# Patient Record
Sex: Female | Born: 1960 | Race: Black or African American | Hispanic: No | Marital: Single | Smoking: Current every day smoker
Health system: Southern US, Community
[De-identification: ages and names within clinical notes are randomized; demographics above are authoritative.]

## PROBLEM LIST (undated history)

## (undated) DIAGNOSIS — K219 Gastro-esophageal reflux disease without esophagitis: Secondary | ICD-10-CM

## (undated) DIAGNOSIS — Z789 Other specified health status: Secondary | ICD-10-CM

## (undated) HISTORY — PX: CERVICAL FUSION: SHX112

---

## 1997-11-08 ENCOUNTER — Other Ambulatory Visit: Admission: RE | Admit: 1997-11-08 | Discharge: 1997-11-08 | Payer: Self-pay | Admitting: Family Medicine

## 1998-07-09 DIAGNOSIS — F259 Schizoaffective disorder, unspecified: Secondary | ICD-10-CM | POA: Insufficient documentation

## 1998-08-01 ENCOUNTER — Encounter: Payer: Self-pay | Admitting: Family Medicine

## 1998-08-01 ENCOUNTER — Ambulatory Visit (HOSPITAL_COMMUNITY): Admission: RE | Admit: 1998-08-01 | Discharge: 1998-08-01 | Payer: Self-pay | Admitting: Family Medicine

## 1998-08-20 ENCOUNTER — Other Ambulatory Visit: Admission: RE | Admit: 1998-08-20 | Discharge: 1998-08-20 | Payer: Self-pay | Admitting: Obstetrics and Gynecology

## 1999-02-13 ENCOUNTER — Ambulatory Visit (HOSPITAL_COMMUNITY): Admission: RE | Admit: 1999-02-13 | Discharge: 1999-02-13 | Payer: Self-pay | Admitting: Obstetrics and Gynecology

## 2000-06-24 ENCOUNTER — Emergency Department (HOSPITAL_COMMUNITY): Admission: EM | Admit: 2000-06-24 | Discharge: 2000-06-24 | Payer: Self-pay | Admitting: Emergency Medicine

## 2000-09-10 ENCOUNTER — Ambulatory Visit (HOSPITAL_COMMUNITY): Admission: RE | Admit: 2000-09-10 | Discharge: 2000-09-10 | Payer: Self-pay | Admitting: Chiropractic Medicine

## 2000-09-10 ENCOUNTER — Encounter: Payer: Self-pay | Admitting: Chiropractic Medicine

## 2000-09-29 ENCOUNTER — Encounter: Admission: RE | Admit: 2000-09-29 | Discharge: 2000-09-29 | Payer: Self-pay | Admitting: Neurosurgery

## 2000-09-29 ENCOUNTER — Encounter: Payer: Self-pay | Admitting: Neurosurgery

## 2000-10-12 ENCOUNTER — Ambulatory Visit (HOSPITAL_COMMUNITY): Admission: RE | Admit: 2000-10-12 | Discharge: 2000-10-12 | Payer: Self-pay | Admitting: Neurosurgery

## 2000-10-12 ENCOUNTER — Encounter: Payer: Self-pay | Admitting: Neurosurgery

## 2000-10-27 ENCOUNTER — Other Ambulatory Visit: Admission: RE | Admit: 2000-10-27 | Discharge: 2000-10-27 | Payer: Self-pay | Admitting: Family Medicine

## 2000-12-05 ENCOUNTER — Emergency Department (HOSPITAL_COMMUNITY): Admission: EM | Admit: 2000-12-05 | Discharge: 2000-12-06 | Payer: Self-pay | Admitting: Emergency Medicine

## 2001-08-25 ENCOUNTER — Encounter: Payer: Self-pay | Admitting: Neurosurgery

## 2001-08-25 ENCOUNTER — Ambulatory Visit (HOSPITAL_COMMUNITY): Admission: RE | Admit: 2001-08-25 | Discharge: 2001-08-25 | Payer: Self-pay | Admitting: Neurosurgery

## 2001-09-06 ENCOUNTER — Encounter: Payer: Self-pay | Admitting: Neurosurgery

## 2001-09-06 ENCOUNTER — Ambulatory Visit (HOSPITAL_COMMUNITY): Admission: RE | Admit: 2001-09-06 | Discharge: 2001-09-06 | Payer: Self-pay | Admitting: Neurosurgery

## 2002-02-15 DIAGNOSIS — B0089 Other herpesviral infection: Secondary | ICD-10-CM

## 2002-06-07 ENCOUNTER — Emergency Department (HOSPITAL_COMMUNITY): Admission: EM | Admit: 2002-06-07 | Discharge: 2002-06-07 | Payer: Self-pay | Admitting: Emergency Medicine

## 2002-06-15 ENCOUNTER — Ambulatory Visit (HOSPITAL_COMMUNITY): Admission: RE | Admit: 2002-06-15 | Discharge: 2002-06-15 | Payer: Self-pay | Admitting: Neurosurgery

## 2002-06-15 ENCOUNTER — Encounter: Payer: Self-pay | Admitting: Neurosurgery

## 2002-06-27 ENCOUNTER — Emergency Department (HOSPITAL_COMMUNITY): Admission: EM | Admit: 2002-06-27 | Discharge: 2002-06-27 | Payer: Self-pay | Admitting: Emergency Medicine

## 2002-07-26 DIAGNOSIS — L219 Seborrheic dermatitis, unspecified: Secondary | ICD-10-CM

## 2002-07-31 ENCOUNTER — Encounter: Admission: RE | Admit: 2002-07-31 | Discharge: 2002-08-16 | Payer: Self-pay | Admitting: Family Medicine

## 2003-04-30 ENCOUNTER — Encounter: Payer: Self-pay | Admitting: Emergency Medicine

## 2003-04-30 ENCOUNTER — Emergency Department (HOSPITAL_COMMUNITY): Admission: EM | Admit: 2003-04-30 | Discharge: 2003-04-30 | Payer: Self-pay | Admitting: Emergency Medicine

## 2003-05-22 ENCOUNTER — Encounter: Admission: RE | Admit: 2003-05-22 | Discharge: 2003-05-22 | Payer: Self-pay | Admitting: Family Medicine

## 2004-01-30 DIAGNOSIS — N949 Unspecified condition associated with female genital organs and menstrual cycle: Secondary | ICD-10-CM

## 2004-09-10 ENCOUNTER — Ambulatory Visit (HOSPITAL_COMMUNITY): Admission: RE | Admit: 2004-09-10 | Discharge: 2004-09-10 | Payer: Self-pay | Admitting: Family Medicine

## 2005-11-09 ENCOUNTER — Other Ambulatory Visit: Admission: RE | Admit: 2005-11-09 | Discharge: 2005-11-09 | Payer: Self-pay | Admitting: Family Medicine

## 2005-11-09 ENCOUNTER — Ambulatory Visit: Payer: Self-pay | Admitting: Family Medicine

## 2005-11-09 ENCOUNTER — Encounter (INDEPENDENT_AMBULATORY_CARE_PROVIDER_SITE_OTHER): Payer: Self-pay | Admitting: Family Medicine

## 2005-11-09 LAB — CONVERTED CEMR LAB: Pap Smear: NORMAL

## 2006-06-04 ENCOUNTER — Emergency Department (HOSPITAL_COMMUNITY): Admission: EM | Admit: 2006-06-04 | Discharge: 2006-06-05 | Payer: Self-pay | Admitting: Emergency Medicine

## 2006-10-11 ENCOUNTER — Encounter: Admission: RE | Admit: 2006-10-11 | Discharge: 2006-10-11 | Payer: Self-pay | Admitting: Family Medicine

## 2006-10-14 ENCOUNTER — Emergency Department (HOSPITAL_COMMUNITY): Admission: EM | Admit: 2006-10-14 | Discharge: 2006-10-14 | Payer: Self-pay | Admitting: Emergency Medicine

## 2007-01-04 ENCOUNTER — Ambulatory Visit: Payer: Self-pay | Admitting: Family Medicine

## 2007-01-04 DIAGNOSIS — K219 Gastro-esophageal reflux disease without esophagitis: Secondary | ICD-10-CM

## 2007-02-19 ENCOUNTER — Emergency Department (HOSPITAL_COMMUNITY): Admission: EM | Admit: 2007-02-19 | Discharge: 2007-02-19 | Payer: Self-pay | Admitting: Emergency Medicine

## 2007-11-01 ENCOUNTER — Encounter (INDEPENDENT_AMBULATORY_CARE_PROVIDER_SITE_OTHER): Payer: Self-pay | Admitting: Family Medicine

## 2007-12-07 ENCOUNTER — Encounter: Admission: RE | Admit: 2007-12-07 | Discharge: 2007-12-07 | Payer: Self-pay | Admitting: Family Medicine

## 2008-02-20 DIAGNOSIS — IMO0001 Reserved for inherently not codable concepts without codable children: Secondary | ICD-10-CM

## 2008-03-05 ENCOUNTER — Telehealth (INDEPENDENT_AMBULATORY_CARE_PROVIDER_SITE_OTHER): Payer: Self-pay | Admitting: Nurse Practitioner

## 2008-03-20 ENCOUNTER — Ambulatory Visit: Payer: Self-pay | Admitting: Family Medicine

## 2008-03-20 ENCOUNTER — Encounter (INDEPENDENT_AMBULATORY_CARE_PROVIDER_SITE_OTHER): Payer: Self-pay | Admitting: Nurse Practitioner

## 2008-03-20 ENCOUNTER — Encounter (INDEPENDENT_AMBULATORY_CARE_PROVIDER_SITE_OTHER): Payer: Self-pay | Admitting: Family Medicine

## 2008-03-20 LAB — CONVERTED CEMR LAB
Bilirubin Urine: NEGATIVE
Chlamydia, DNA Probe: NEGATIVE
GC Probe Amp, Genital: NEGATIVE
Glucose, Urine, Semiquant: NEGATIVE
Ketones, urine, test strip: NEGATIVE
Nitrite: NEGATIVE
Protein, U semiquant: NEGATIVE
Specific Gravity, Urine: 1.025
Urobilinogen, UA: 0.2
pH: 5.5

## 2008-03-25 ENCOUNTER — Encounter (INDEPENDENT_AMBULATORY_CARE_PROVIDER_SITE_OTHER): Payer: Self-pay | Admitting: Family Medicine

## 2008-04-01 ENCOUNTER — Encounter (INDEPENDENT_AMBULATORY_CARE_PROVIDER_SITE_OTHER): Payer: Self-pay | Admitting: Family Medicine

## 2008-04-26 ENCOUNTER — Encounter (INDEPENDENT_AMBULATORY_CARE_PROVIDER_SITE_OTHER): Payer: Self-pay | Admitting: Family Medicine

## 2008-05-04 ENCOUNTER — Emergency Department (HOSPITAL_COMMUNITY): Admission: EM | Admit: 2008-05-04 | Discharge: 2008-05-04 | Payer: Self-pay | Admitting: Emergency Medicine

## 2008-05-05 ENCOUNTER — Encounter: Admission: RE | Admit: 2008-05-05 | Discharge: 2008-05-05 | Payer: Self-pay | Admitting: Neurology

## 2008-07-26 ENCOUNTER — Emergency Department (HOSPITAL_COMMUNITY): Admission: EM | Admit: 2008-07-26 | Discharge: 2008-07-26 | Payer: Self-pay | Admitting: Emergency Medicine

## 2008-08-04 ENCOUNTER — Encounter: Admission: RE | Admit: 2008-08-04 | Discharge: 2008-08-04 | Payer: Self-pay | Admitting: Neurosurgery

## 2008-08-08 ENCOUNTER — Emergency Department (HOSPITAL_COMMUNITY): Admission: EM | Admit: 2008-08-08 | Discharge: 2008-08-08 | Payer: Self-pay | Admitting: Emergency Medicine

## 2008-08-29 ENCOUNTER — Telehealth (INDEPENDENT_AMBULATORY_CARE_PROVIDER_SITE_OTHER): Payer: Self-pay | Admitting: Family Medicine

## 2008-09-06 ENCOUNTER — Encounter: Admission: RE | Admit: 2008-09-06 | Discharge: 2008-09-06 | Payer: Self-pay

## 2008-09-06 ENCOUNTER — Encounter (INDEPENDENT_AMBULATORY_CARE_PROVIDER_SITE_OTHER): Payer: Self-pay | Admitting: Family Medicine

## 2008-09-06 ENCOUNTER — Ambulatory Visit (HOSPITAL_COMMUNITY): Admission: RE | Admit: 2008-09-06 | Discharge: 2008-09-06 | Payer: Self-pay | Admitting: Neurosurgery

## 2008-09-07 ENCOUNTER — Encounter (INDEPENDENT_AMBULATORY_CARE_PROVIDER_SITE_OTHER): Payer: Self-pay | Admitting: Family Medicine

## 2008-09-07 ENCOUNTER — Ambulatory Visit (HOSPITAL_COMMUNITY): Admission: RE | Admit: 2008-09-07 | Discharge: 2008-09-08 | Payer: Self-pay | Admitting: Neurosurgery

## 2008-10-01 ENCOUNTER — Encounter: Admission: RE | Admit: 2008-10-01 | Discharge: 2008-10-01 | Payer: Self-pay | Admitting: Neurosurgery

## 2008-11-08 ENCOUNTER — Encounter: Admission: RE | Admit: 2008-11-08 | Discharge: 2008-12-07 | Payer: Self-pay | Admitting: Neurosurgery

## 2008-11-13 ENCOUNTER — Ambulatory Visit: Payer: Self-pay | Admitting: Family Medicine

## 2008-11-13 DIAGNOSIS — I803 Phlebitis and thrombophlebitis of lower extremities, unspecified: Secondary | ICD-10-CM

## 2008-12-05 ENCOUNTER — Ambulatory Visit: Payer: Self-pay | Admitting: Family Medicine

## 2008-12-07 ENCOUNTER — Encounter: Admission: RE | Admit: 2008-12-07 | Discharge: 2008-12-07 | Payer: Self-pay | Admitting: Family Medicine

## 2009-02-01 ENCOUNTER — Encounter (INDEPENDENT_AMBULATORY_CARE_PROVIDER_SITE_OTHER): Payer: Self-pay | Admitting: Nurse Practitioner

## 2009-02-20 ENCOUNTER — Ambulatory Visit: Payer: Self-pay | Admitting: Nurse Practitioner

## 2009-02-20 DIAGNOSIS — F17200 Nicotine dependence, unspecified, uncomplicated: Secondary | ICD-10-CM

## 2009-02-20 DIAGNOSIS — B009 Herpesviral infection, unspecified: Secondary | ICD-10-CM | POA: Insufficient documentation

## 2009-02-20 LAB — CONVERTED CEMR LAB
Bilirubin Urine: NEGATIVE
Glucose, Urine, Semiquant: NEGATIVE
KOH Prep: NEGATIVE
Nitrite: POSITIVE
Protein, U semiquant: 30
Specific Gravity, Urine: 1.025
Urobilinogen, UA: 0.2
WBC Urine, dipstick: NEGATIVE
pH: 5.5

## 2009-02-21 ENCOUNTER — Encounter (INDEPENDENT_AMBULATORY_CARE_PROVIDER_SITE_OTHER): Payer: Self-pay | Admitting: Internal Medicine

## 2009-05-10 ENCOUNTER — Ambulatory Visit: Payer: Self-pay | Admitting: Nurse Practitioner

## 2009-05-10 DIAGNOSIS — M539 Dorsopathy, unspecified: Secondary | ICD-10-CM | POA: Insufficient documentation

## 2009-05-10 LAB — CONVERTED CEMR LAB
Bilirubin Urine: NEGATIVE
Chlamydia, Swab/Urine, PCR: NEGATIVE
GC Probe Amp, Urine: NEGATIVE
Glucose, Urine, Semiquant: NEGATIVE
KOH Prep: NEGATIVE
Ketones, urine, test strip: NEGATIVE
Nitrite: NEGATIVE
Protein, U semiquant: NEGATIVE
Specific Gravity, Urine: 1.015
Urobilinogen, UA: 0.2
WBC Urine, dipstick: NEGATIVE
pH: 6.5

## 2009-05-20 ENCOUNTER — Telehealth (INDEPENDENT_AMBULATORY_CARE_PROVIDER_SITE_OTHER): Payer: Self-pay | Admitting: Nurse Practitioner

## 2009-05-29 ENCOUNTER — Telehealth (INDEPENDENT_AMBULATORY_CARE_PROVIDER_SITE_OTHER): Payer: Self-pay | Admitting: Nurse Practitioner

## 2009-05-31 ENCOUNTER — Ambulatory Visit: Payer: Self-pay | Admitting: Nurse Practitioner

## 2009-05-31 LAB — CONVERTED CEMR LAB
Bilirubin Urine: NEGATIVE
Blood in Urine, dipstick: NEGATIVE
Glucose, Urine, Semiquant: NEGATIVE
Ketones, urine, test strip: NEGATIVE
Nitrite: NEGATIVE
Protein, U semiquant: 30
Specific Gravity, Urine: >=1.03
Urobilinogen, UA: 0.2
pH: 5.5

## 2009-06-03 ENCOUNTER — Telehealth (INDEPENDENT_AMBULATORY_CARE_PROVIDER_SITE_OTHER): Payer: Self-pay | Admitting: Nurse Practitioner

## 2009-06-03 ENCOUNTER — Encounter (INDEPENDENT_AMBULATORY_CARE_PROVIDER_SITE_OTHER): Payer: Self-pay | Admitting: Nurse Practitioner

## 2009-06-03 LAB — CONVERTED CEMR LAB
Chlamydia, Swab/Urine, PCR: NEGATIVE
GC Probe Amp, Urine: NEGATIVE

## 2009-06-04 ENCOUNTER — Encounter (INDEPENDENT_AMBULATORY_CARE_PROVIDER_SITE_OTHER): Payer: Self-pay | Admitting: Nurse Practitioner

## 2009-06-18 ENCOUNTER — Telehealth (INDEPENDENT_AMBULATORY_CARE_PROVIDER_SITE_OTHER): Payer: Self-pay | Admitting: Nurse Practitioner

## 2009-06-20 ENCOUNTER — Ambulatory Visit: Payer: Self-pay | Admitting: Physician Assistant

## 2009-06-20 ENCOUNTER — Encounter (INDEPENDENT_AMBULATORY_CARE_PROVIDER_SITE_OTHER): Payer: Self-pay | Admitting: Nurse Practitioner

## 2009-06-20 ENCOUNTER — Other Ambulatory Visit: Admission: RE | Admit: 2009-06-20 | Discharge: 2009-06-20 | Payer: Self-pay | Admitting: Internal Medicine

## 2009-06-20 DIAGNOSIS — R82998 Other abnormal findings in urine: Secondary | ICD-10-CM | POA: Insufficient documentation

## 2009-06-20 DIAGNOSIS — B373 Candidiasis of vulva and vagina: Secondary | ICD-10-CM

## 2009-06-20 LAB — CONVERTED CEMR LAB
Bilirubin Urine: NEGATIVE
Chlamydia, DNA Probe: NEGATIVE
GC Probe Amp, Genital: NEGATIVE
Glucose, Urine, Semiquant: NEGATIVE
Ketones, urine, test strip: NEGATIVE
Nitrite: NEGATIVE
Specific Gravity, Urine: >=1.03
Urobilinogen, UA: 0.2
WBC Urine, dipstick: NEGATIVE
Whiff Test: POSITIVE
pH: 5.5

## 2009-06-21 ENCOUNTER — Encounter: Payer: Self-pay | Admitting: Physician Assistant

## 2009-06-27 ENCOUNTER — Telehealth (INDEPENDENT_AMBULATORY_CARE_PROVIDER_SITE_OTHER): Payer: Self-pay | Admitting: Nurse Practitioner

## 2009-06-28 ENCOUNTER — Encounter (INDEPENDENT_AMBULATORY_CARE_PROVIDER_SITE_OTHER): Payer: Self-pay | Admitting: Nurse Practitioner

## 2009-07-15 ENCOUNTER — Telehealth (INDEPENDENT_AMBULATORY_CARE_PROVIDER_SITE_OTHER): Payer: Self-pay | Admitting: Nurse Practitioner

## 2009-07-19 ENCOUNTER — Encounter: Payer: Self-pay | Admitting: Nurse Practitioner

## 2009-08-15 ENCOUNTER — Ambulatory Visit: Payer: Self-pay | Admitting: Nurse Practitioner

## 2009-08-15 DIAGNOSIS — R030 Elevated blood-pressure reading, without diagnosis of hypertension: Secondary | ICD-10-CM | POA: Insufficient documentation

## 2009-08-16 ENCOUNTER — Telehealth (INDEPENDENT_AMBULATORY_CARE_PROVIDER_SITE_OTHER): Payer: Self-pay | Admitting: Nurse Practitioner

## 2009-08-19 ENCOUNTER — Encounter (INDEPENDENT_AMBULATORY_CARE_PROVIDER_SITE_OTHER): Payer: Self-pay | Admitting: *Deleted

## 2009-10-14 ENCOUNTER — Telehealth (INDEPENDENT_AMBULATORY_CARE_PROVIDER_SITE_OTHER): Payer: Self-pay | Admitting: *Deleted

## 2009-11-04 ENCOUNTER — Telehealth (INDEPENDENT_AMBULATORY_CARE_PROVIDER_SITE_OTHER): Payer: Self-pay | Admitting: Nurse Practitioner

## 2009-11-07 ENCOUNTER — Encounter (INDEPENDENT_AMBULATORY_CARE_PROVIDER_SITE_OTHER): Payer: Self-pay | Admitting: *Deleted

## 2009-11-12 ENCOUNTER — Ambulatory Visit: Payer: Self-pay | Admitting: Nurse Practitioner

## 2009-11-12 DIAGNOSIS — R21 Rash and other nonspecific skin eruption: Secondary | ICD-10-CM | POA: Insufficient documentation

## 2009-12-24 ENCOUNTER — Encounter (INDEPENDENT_AMBULATORY_CARE_PROVIDER_SITE_OTHER): Payer: Self-pay | Admitting: Nurse Practitioner

## 2009-12-24 ENCOUNTER — Telehealth (INDEPENDENT_AMBULATORY_CARE_PROVIDER_SITE_OTHER): Payer: Self-pay | Admitting: Nurse Practitioner

## 2009-12-24 DIAGNOSIS — R519 Headache, unspecified: Secondary | ICD-10-CM | POA: Insufficient documentation

## 2009-12-24 DIAGNOSIS — R51 Headache: Secondary | ICD-10-CM

## 2010-02-24 ENCOUNTER — Encounter (INDEPENDENT_AMBULATORY_CARE_PROVIDER_SITE_OTHER): Payer: Self-pay | Admitting: Nurse Practitioner

## 2010-08-03 ENCOUNTER — Encounter: Payer: Self-pay | Admitting: Neurosurgery

## 2010-08-03 ENCOUNTER — Encounter: Payer: Self-pay | Admitting: Occupational Therapy

## 2010-08-12 NOTE — Assessment & Plan Note (Signed)
Summary: Acute - Rash   Vital Signs:  Patient profile:   50 year old female Menstrual status:  regular Weight:      173.4 pounds BMI:     29.87 BSA:     1.84 Temp:     97.8 degrees F oral Pulse rate:   81 / minute Pulse rhythm:   regular Resp:     20 per minute BP sitting:   125 / 84  (left arm) Cuff size:   regular  Vitals Entered By: Levon Hedger (Nov 12, 2009 12:44 PM) CC: bed bug bites with alot of itching..needs to discuss herpes , Rash Is Patient Diabetic? No Pain Assessment Patient in pain? no       Does patient need assistance? Functional Status Self care Ambulation Normal   CC:  bed bug bites with alot of itching..needs to discuss herpes  and Rash.  History of Present Illness:  Pt into the office with complaints of rash - only on back  Rash      This is a 50 year old woman who presents with Rash.  The symptoms began 3 days ago.  The severity is described as moderate.  The patient complains of itching, but denies pustules and blisters.  The rash is located on the back.  The rash is worse with scratching.  The patient denies the following symptoms: fever and headache.  The patient reports a history of other insect bite.   Reports that she purchased a sofa from a second hand store.  noticed problems with back after that purchase. Pt is married and notes that her husband has also had similar problems with skin,  Allergies (verified): No Known Drug Allergies  Review of Systems GU:  Complains of genital sores; denies dysuria; vaginal tingling and itching - Dx with herpes last year and took medications once.  lesions have returned about 2 months ago. Derm:  Complains of rash.  Physical Exam  General:  alert.   Head:  normocephalic.   Lungs:  normal breath sounds.   Heart:  normal rate and regular rhythm.   Neurologic:  alert & oriented X3.   Skin:  back with sporatic lesions, crusted no other areas of involvement Psych:  Oriented X3.     Impression &  Recommendations:  Problem # 1:  SKIN RASH (ICD-782.1) handout given to pt about bed bugs advised pt that sofa acquired second hand may be source she need to clean her house Her updated medication list for this problem includes:    Triamcinolone Acetonide 0.1 % Oint (Triamcinolone acetonide) .Marland Kitchen... Apply to affected area two times a day as needed to affected area  Problem # 2:  HSV (ICD-054.9) reviewed dx with pt will treat with antivirals  Complete Medication List: 1)  Valtrex 1 Gm Tabs (Valacyclovir hcl) .... One gram two times a day x 10 days 2)  Zanaflex 2 Mg Caps (Tizanidine hcl) .... One tablet by mouth two times a day 3)  Amitriptyline Hcl 25 Mg Tabs (Amitriptyline hcl) .... One tablet by mouth in the morning and 2 tablets at bedtime 4)  Diclofenac Sodium 75 Mg Tbec (Diclofenac sodium) .... One tablet by mouth two times a day 5)  Triamcinolone Acetonide 0.1 % Oint (Triamcinolone acetonide) .... Apply to affected area two times a day as needed to affected area 6)  Permethrin 5 % Crea (Permethrin) .... Massage cream ino entire body - wash off in 8 to 12 hours  Patient Instructions: 1)  Read  the handout  2)  follow up as needed Prescriptions: PERMETHRIN 5 % CREA (PERMETHRIN) Massage cream ino entire body - wash off in 8 to 12 hours  #60gm x 0   Entered and Authorized by:   Lehman Prom FNP   Signed by:   Lehman Prom FNP on 11/12/2009   Method used:   Print then Give to Patient   RxID:   [id number] TRIAMCINOLONE ACETONIDE 0.1 % OINT (TRIAMCINOLONE ACETONIDE) Apply to affected area two times a day as needed to affected area  #60gm x 0   Entered and Authorized by:   Lehman Prom FNP   Signed by:   Lehman Prom FNP on 11/12/2009   Method used:   Print then Give to Patient   RxID:   [id number] VALTREX 1 GM TABS (VALACYCLOVIR HCL) One gram two times a day x 10 days  #20 x 0   Entered and Authorized by:   Lehman Prom FNP   Signed by:   Lehman Prom FNP  on 11/12/2009   Method used:   Print then Give to Patient   RxID:   [id number]

## 2010-08-12 NOTE — Letter (Signed)
Summary: *HSN Results Follow up  [facility]  [address] [place], [place] [address]   Phone: [phone]  Fax: [phone]      11/07/2009   Shiryl D Holben [address] [place], [place]  [address]   Dear  Ms. Rene Kocher [place],                            ____S.Drinkard,FNP   ____D. Gore,FNP       ____B. McPherson,MD   ____V. Rankins,MD    ____E. Mulberry,MD    ____N. Daphine Deutscher, FNP  ____D. Reche Dixon, MD    ____K. Philipp Deputy, MD    ____Other     This letter is to inform you that your recent test(s):  _______Pap Smear    _______Lab Test     _______X-ray    _______ is within acceptable limits  _______ requires a medication change  _______ requires a follow-up lab visit  _______ requires a follow-up visit with your provider   Comments:  We have been trying to reach you.  Please give the office a call at your eariest convenience.       _________________________________________________________ If you have any questions, please contact our office                     Sincerely,  [place] Shannon [facility]

## 2010-08-12 NOTE — Assessment & Plan Note (Signed)
Summary: Missed appointment  Pt checked in then left the building. CMA called her name twice and pt was not in the building. When Ms. Schepp returned she  informed the front desk staff that she had gone to get her something to eat. The provider had moved past the pt's appointment and she was advised to reschedule see if somone no-shows in the afternoon.  Vital Signs:  Patient profile:   50 year old female Menstrual status:  regular Pulse rhythm:   regular Cuff size:   regular  Vitals Entered By: Levon Hedger (July 19, 2009 11:58 AM)  Allergies: No Known Drug Allergies   Complete Medication List: 1)  Valtrex 1 Gm Tabs (Valacyclovir hcl) .... One gram two times a day x 10 days 2)  Zanaflex 2 Mg Caps (Tizanidine hcl) .... One tablet by mouth two times a day 3)  Amitriptyline Hcl 25 Mg Tabs (Amitriptyline hcl) .... One tablet by mouth in the morning and 2 tablets at bedtime 4)  Diclofenac Sodium 75 Mg Tbec (Diclofenac sodium) .... One tablet by mouth two times a day 5)  Sulfamethoxazole-tmp Ds 800-160 Mg Tabs (Sulfamethoxazole-trimethoprim) .... One tablet by mouth two times a day for infection 6)  Diflucan 150 Mg Tabs (Fluconazole) .Marland Kitchen.. 1 by mouth x 1

## 2010-08-12 NOTE — Progress Notes (Signed)
  Phone Note Outgoing Call   Summary of Call: Left message on answering machine for pt to call back..... to inform about the no shows Initial call taken by: [place] Shannon,  November 04, 2009 9:12 AM  Follow-up for Phone Call        Left message on answering machine for pt to call back.Marland KitchenMarland KitchenArmenia Shannon  November 06, 2009 10:13 AM    Left message on answering machine for pt to call back... will mail letter.... [place] Shannon  November 07, 2009 12:29 PM

## 2010-08-12 NOTE — Progress Notes (Signed)
Summary: DIDNT MAKE ULTRASOUND APPT TODAY  Phone Note Call from Patient Call back at Home Phone [phone]   Summary of Call: MARTIN PT. MS [place] CALLED TO LET YOU KNOW THAT SHE DIDN'T MAKE THE APPT. ON TODAY FOR HER ULTRASOUND, AND WOULD LIKE FOR [place] TO RESCHEDULE IT FOR HER. Initial call taken by: Leodis Rains,  August 16, 2009 2:32 PM  Follow-up for Phone Call        forward to N. Daphine Deutscher, FNP Follow-up by: Levon Hedger,  August 16, 2009 2:55 PM  Additional Follow-up for Phone Call Additional follow up Details #1::        Why did pt miss the appt? Ok to reschedule but let pt know that the office is not able to keep rescheduling the appt when she misses Additional Follow-up by: Lehman Prom FNP,  August 16, 2009 4:24 PM    Additional Follow-up for Phone Call Additional follow up Details #2::    called [phone] this is not a valid # Levon Hedger  August 16, 2009 4:30 PM  Levon Hedger  August 19, 2009 12:32 PM called [phone] the number you have dialed is not valid.  Will mail letter.
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
## 2010-08-12 NOTE — Letter (Signed)
Summary: TEST ORDER FORM//ULTRASOUND//APPT DATE & TIME  TEST ORDER FORM//ULTRASOUND//APPT DATE & TIME   Imported By: Arta Bruce 10/07/2009 15:33:26  _____________________________________________________________________  External Attachment:    Type:   Image     Comment:   External Document

## 2010-08-12 NOTE — Progress Notes (Signed)
Summary: Pt needs the provider to call her back  Phone Note Call from Patient Call back at [facility] Phone [phone] Call back at [phone]   Summary of Call: The pt wants the provider call her back and speak about herpes virus. V[facility] FNP Initial call taken by: Morgan Obrien,  October 14, 2009 4:49 PM  Follow-up for Phone Call        Morgan Obrien October 14, 2009 11:45 AM Left message on machine for pt to return call to the office  Morgan Obrien  October 15, 2009 3:57 PM Left message on the machine for pt to return call to the office.  The pt will come on October 18, 2009 at 9:30 .Morgan Obrien  October 16, 2009 11:52 AM

## 2010-08-12 NOTE — Progress Notes (Signed)
Summary: Nausea  Phone Note Call from Patient   Summary of Call: spoke with pt and she said she is having nausea...Marland KitchenMarland Kitchen pt says this started today.... pt denies vomiting.... pt did eat a sandwich.... pt wanted to [place] to know since she just came here and due to her conditions on that day she wanted to make sure everything is okay Initial call taken by: [place] Shannon,  July 15, 2009 4:38 PM  Follow-up for Phone Call        advise bland diet plenty of fluids may last 24 or 48 hours if symtoms continue over 72 hours she will need to be seen Follow-up by: Lehman Prom FNP,  July 15, 2009 5:04 PM  Additional Follow-up for Phone Call Additional follow up Details #1::        spoke with pt and she is advised to do bland diet...Marland KitchenMarland KitchenArmenia Shannon  July 16, 2009 12:10 PM

## 2010-08-12 NOTE — Letter (Signed)
Summary: [facility]   Imported By: Arta Bruce 12/26/2009 10:06:26  _____________________________________________________________________  External Attachment:    Type:   Image     Comment:   External Document

## 2010-08-12 NOTE — Letter (Signed)
Summary: GYNECOLOGIC CYTOLOGY REPORT  GYNECOLOGIC CYTOLOGY REPORT   Imported By: Arta Bruce 08/15/2009 14:51:41  _____________________________________________________________________  External Attachment:    Type:   Image     Comment:   External Document
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
## 2010-08-12 NOTE — Letter (Signed)
Summary: [facility]  [facility]   Imported By: Arta Bruce 03/06/2010 14:36:43  _____________________________________________________________________  External Attachment:    Type:   Image     Comment:   External Document

## 2010-08-12 NOTE — Letter (Signed)
Summary: *HSN Results Follow up  [facility]  [address] [place], [place] [address]   Phone: [phone]  Fax: [phone]      08/19/2009   Baby D Tipping [address] [place], [place]  [address]   Dear  Ms. Rene Kocher [place],                            ____S.Drinkard,FNP   ____D. Gore,FNP       ____B. McPherson,MD   ____V. Rankins,MD    ____E. Mulberry,MD    ____N. Daphine Deutscher, FNP  ____D. Reche Dixon, MD    ____K. Philipp Deputy, MD    ____Other     This letter is to inform you that your recent test(s):  _______Pap Smear    _______Lab Test     _______X-ray    _______ is within acceptable limits  _______ requires a medication change  _______ requires a follow-up lab visit  _______ requires a follow-up visit with your provider   Comments: We have been trying to reach you at [phone].  Please contact the office at your earliest convenience.      _________________________________________________________ If you have any questions, please contact our office                     Sincerely,  Levon Hedger [facility]
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
## 2010-10-28 LAB — CBC
Platelets: 289 10*3/uL (ref 150–400)
RDW: 13.8 % (ref 11.5–15.5)

## 2010-11-25 NOTE — Op Note (Signed)
NAME:  Morgan Obrien, Morgan Obrien             ACCOUNT NO.:  [id number]   MEDICAL RECORD NO.:  [id number]          PATIENT TYPE:  OIB   LOCATION:  3528                         FACILITY:  [facility]   PHYSICIAN:  Reinaldo Meeker, M.D. 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   PROCEDURE:  C3-4 and C4-5 anterior cervical diskectomy with bone bank  fusion followed by Helix anterior cervical plating.   SURGEON:  Reinaldo Meeker, MD   ASSISTANT:  Kathaleen Maser. Pool, MD   SURGICAL INDICATIONS:  Morgan Obrien is a 50 year old female with history  of neck and bilateral upper extremity pain, more so on the left than the  right.  She also had signs of cervical myelopathy.  She tried  conservative therapy without improvement.  MRI scan showed a herniated  disk at C3-4 with spinal cord compression and small central disk at C4-5  with spinal cord compression.  In addition, she had a small disk  abnormality at C6-7 and this was not felt to be clinically significant.  After discussing the options and failing conservative therapy, she  requested surgical intervention.  She is now brought to the hospital for  C3-4 and C4-5 anterior cervical diskectomy with fusion and plating.  I  had a long discussion with her regarding all the risks and benefits of  surgical intervention.  The risks discussed include but are limited to  bleeding, infection, paralysis, spinal fluid leak, trouble with  instrumentation, nonunion versus coma, and death.  We have discussed the  chance of persistent pain requiring more surgery.  We discussed  alternative methods of therapy and all the risks and benefits of non-  intervention.  Morgan Obrien had the opportunity to ask  numerous  questions and appears to understand.  With this information in hand, she  has requested to proceed with surgery and she is brought to the hospital  at this time for surgical intervention.   PROCEDURE IN DETAIL:  After placed in the supine position and a 5-pound  halter traction, the patient's neck was prepped and draped in the usual  sterile fashion.  Localizing fluoroscopy was used prior to incision to  identify the appropriate level.  Transverse incision was made on the  right anterior neck started at the midline, headed towards the medial  aspect of the sternocleidomastoid muscle.  The platysma muscle was then  incised transversely.  A natural fascial plane between the strap muscles  medially and sternocleidomastoid laterally was identified and followed  down to the anterior aspect of the cervical spine.  Longus colli muscles  were identified, split in the midline, stripped away bilaterally with a  Medical illustrator.  Self-retaining retractor was placed for  exposure.  X-rays showed approach to be at the appropriate levels.  Using a 15 blade, the herniated disk at C3-4 and  C4-5 was incised.  Using pituitary rongeurs and curettes, approximately 90% disk material  was removed at both levels.  High-speed drill was used to widen the  interspace at both levels.  At this time, the microscope was draped,  brought in the field, and used for the remainder of the case.  Starting  at C3-4, the remainder of the disk material and posterior longitudinal  ligament was removed. The ligament was then incised and the cut edges  were removed with Kerrison punch.  Herniated disk material was removed  along with bony overgrowth until the spinal dura and proximal foraminal  nerve roots could be identified and were found to be well decompressed  bilaterally.  Irrigation was carried out and attention was then turned  to C4-5 where a similar procedure was carried out.  Once again,   herniated disk material in the midline was removed, which had been  compressing the spinal cord.  A thorough decompression was once again  carried out in the spinal dura and proximal foraminal nerve roots  bilaterally at this level as well until there was no evidence of neural  compression at either level.  At this time, inspection was carried out  once more.  Two 7-mm bone bank plugs were reconstituted.  After  irrigating once more confirming hemostasis, the plugs impacted without  difficulty and fluoroscopy showed that to be in good position.  A 42-mm  anterior cervical plate was then chosen.  Under fluoroscopic guidance,  drill holes were placed followed by placing of 13-mm screws x6 until the  locking mechanism was secured bilaterally at all 3 levels.  Large  amounts of irrigation were carried out and the bleeding was controlled  with bipolar coagulation.  The wound was then closed with interrupted  Vicryl on the platysma muscle, inverted 5-0 PDS on the subcuticular  layer, and Steri-Strips on the skin.  A sterile dressing and soft collar  were applied.  The patient was extubated and taken to the recovery room  in stable condition.           ______________________________  Reinaldo Meeker, M.D.     ROK/MEDQ  D:  09/07/2008  T:  09/07/2008  Job:  [id number]

## 2011-10-02 ENCOUNTER — Telehealth: Payer: Self-pay | Admitting: *Deleted

## 2011-10-02 NOTE — Telephone Encounter (Signed)
Pt No Show for Previst on 10/02/2011.  Called both telephone numbers listed as contacts numbers for pt.  Phones have been disconnected.  No Show letter mailed to pt. Ezra Sites

## 2011-10-20 ENCOUNTER — Other Ambulatory Visit: Payer: Self-pay | Admitting: Gastroenterology

## 2012-11-04 ENCOUNTER — Other Ambulatory Visit: Payer: Self-pay

## 2012-11-07 ENCOUNTER — Other Ambulatory Visit: Payer: Self-pay

## 2012-11-07 DIAGNOSIS — Z1231 Encounter for screening mammogram for malignant neoplasm of breast: Secondary | ICD-10-CM

## 2012-11-16 ENCOUNTER — Ambulatory Visit: Payer: Self-pay

## 2012-11-28 ENCOUNTER — Ambulatory Visit
Admission: RE | Admit: 2012-11-28 | Discharge: 2012-11-28 | Disposition: A | Discharge status: Home / Self Care | Payer: Medicaid Other | Source: Ambulatory Visit

## 2012-11-28 ENCOUNTER — Ambulatory Visit: Payer: Self-pay

## 2012-11-28 DIAGNOSIS — Z1231 Encounter for screening mammogram for malignant neoplasm of breast: Secondary | ICD-10-CM

## 2012-11-29 ENCOUNTER — Other Ambulatory Visit: Payer: Self-pay | Admitting: Family Medicine

## 2012-11-29 DIAGNOSIS — R928 Other abnormal and inconclusive findings on diagnostic imaging of breast: Secondary | ICD-10-CM

## 2012-12-12 ENCOUNTER — Ambulatory Visit
Admission: RE | Admit: 2012-12-12 | Discharge: 2012-12-12 | Disposition: A | Discharge status: Home / Self Care | Payer: Medicaid Other | Source: Ambulatory Visit | Attending: Family Medicine | Admitting: Family Medicine

## 2012-12-12 DIAGNOSIS — R928 Other abnormal and inconclusive findings on diagnostic imaging of breast: Secondary | ICD-10-CM

## 2013-11-10 ENCOUNTER — Other Ambulatory Visit: Payer: Self-pay

## 2013-11-10 DIAGNOSIS — Z1231 Encounter for screening mammogram for malignant neoplasm of breast: Secondary | ICD-10-CM

## 2013-11-30 ENCOUNTER — Ambulatory Visit: Payer: Medicaid Other

## 2015-05-28 ENCOUNTER — Encounter (HOSPITAL_COMMUNITY)
Admission: RE | Admit: 2015-05-28 | Discharge: 2015-05-28 | Disposition: A | Discharge status: Home / Self Care | Payer: Medicaid Other | Source: Ambulatory Visit | Attending: Oral Surgery | Admitting: Oral Surgery

## 2015-05-28 ENCOUNTER — Other Ambulatory Visit (HOSPITAL_COMMUNITY): Payer: Medicaid Other

## 2015-05-28 ENCOUNTER — Encounter (HOSPITAL_COMMUNITY): Payer: Self-pay

## 2015-05-28 DIAGNOSIS — M270 Developmental disorders of jaws: Secondary | ICD-10-CM | POA: Insufficient documentation

## 2015-05-28 DIAGNOSIS — K219 Gastro-esophageal reflux disease without esophagitis: Secondary | ICD-10-CM | POA: Diagnosis not present

## 2015-05-28 DIAGNOSIS — Z7982 Long term (current) use of aspirin: Secondary | ICD-10-CM | POA: Insufficient documentation

## 2015-05-28 DIAGNOSIS — Z01812 Encounter for preprocedural laboratory examination: Secondary | ICD-10-CM | POA: Diagnosis not present

## 2015-05-28 DIAGNOSIS — M278 Other specified diseases of jaws: Secondary | ICD-10-CM | POA: Diagnosis not present

## 2015-05-28 DIAGNOSIS — Z01818 Encounter for other preprocedural examination: Secondary | ICD-10-CM | POA: Diagnosis present

## 2015-05-28 DIAGNOSIS — R079 Chest pain, unspecified: Secondary | ICD-10-CM | POA: Diagnosis not present

## 2015-05-28 HISTORY — DX: Gastro-esophageal reflux disease without esophagitis: K21.9

## 2015-05-28 HISTORY — DX: Other specified health status: Z78.9

## 2015-05-28 NOTE — Progress Notes (Signed)
Patient reports sharp center chest pain with drinking soda only. Patient states that it does not come on any other time and that it feels similar to when she has had GERD in the past. Patient reports that the only time she get short of breath is after ~ 3 flights of steps. Patient denied getting Shob after walking from parking lot into Short stay today. Notified [place], PA of same will obtain EKG no CXR. PCP Herold HarmsBeal at [facility]. Denies other cardiac test.

## 2015-05-28 NOTE — H&P (Signed)
HISTORY AND PHYSICAL  Morgan Obrien is a 54 y.o. female patient  Referred by general dentist for pre-prosthetic surgery for dentures.  No diagnosis found.  Past Medical History  Diagnosis Date  . GERD (gastroesophageal reflux disease)   . Medical history non-contributory     No current facility-administered medications for this encounter.   Current Outpatient Prescriptions  Medication Sig Dispense Refill  . ibuprofen (ADVIL,MOTRIN) 200 MG tablet Take 200 mg by mouth every 6 (six) hours as needed for mild pain.    . Multiple Vitamin (MULTIVITAMIN WITH MINERALS) TABS tablet Take 1 tablet by mouth daily.    Marland Kitchen. aspirin 325 MG tablet Take 325 mg by mouth every 4 (four) hours as needed.     No Known Allergies Active Problems:   * No active hospital problems. *  Vitals: There were no vitals taken for this visit. Lab results:No results found for this or any previous visit (from the past 24 hour(s)). Radiology Results: No results found. General appearance: alert and cooperative Head: Normocephalic, without obvious abnormality, atraumatic Eyes: negative Nose: Nares normal. Septum midline. Mucosa normal. No drainage or sinus tenderness. Throat: Maxillary palatal torus, Hyperplastic maxillary osseous tuberosities,  Bilateral maxillary exostoses. 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  Plan: reduce maxillary exostoses, tuberosity, and palatal torus. General anesthesia. Day surgery.   Georgia LopesJENSEN,Lakena Sparlin M 05/28/2015

## 2015-05-28 NOTE — Pre-Procedure Instructions (Signed)
Morgan JulianRegina D Obrien  05/28/2015     Your procedure is scheduled on November 18.  [facility] Ad[facility] at 6:45 A.M.  Call this number if you have problems the morning of surgery:  [phone]   Remember:  Do not eat food or drink liquids after midnight.  Take these medicines the morning of surgery with A SIP OF WATER: None    STOP/ Do not take Aspirin, Aleve, Naproxen, Advil, Ibuprofen, Motrin, Vitamins, Herbs, or Supplements starting today   Do not wear jewelry, make-up or nail polish.  Do not wear lotions, powders, or perfumes.  You may wear deodorant.  Do not shave 48 hours prior to surgery.  Men may shave face and neck.  Do not bring valuables to the hospital.  [facility] is not responsible for any belongings or valuables.  Contacts, dentures or bridgework may not be worn into surgery.  Leave your suitcase in the car.  After surgery it may be brought to your room.  For patients admitted to the hospital, discharge time will be determined by your treatment team.  Patients discharged the day of surgery will not be allowed to drive home.   Name and phone number of your driver: Family/ Friend  [facility] - Preparing for Surgery  Before surgery, you can play an important role.  Because skin is not sterile, your skin needs to be as free of germs as possible.  You can reduce the number of germs on you skin by washing with CHG (chlorahexidine gluconate) soap before surgery.  CHG is an antiseptic cleaner which kills germs and bonds with the skin to continue killing germs even after washing.  Please DO NOT use if you have an allergy to CHG or antibacterial soaps.  If your skin becomes reddened/irritated stop using the CHG and inform your nurse when you arrive at Short Stay.  Do not shave (including legs and underarms) for at least 48 hours prior to the first CHG shower.  You may shave your face.  Please follow these instructions carefully:   1.  Shower with  CHG Soap the night before surgery and the morning of Surgery.  2.  If you choose to wash your hair, wash your hair first as usual with your normal shampoo.  3.  After you shampoo, rinse your hair and body thoroughly to remove the shampoo.  4.  Use CHG as you would any other liquid soap.  You can apply CHG directly to the skin and wash gently with scrungie or a clean washcloth.  5.  Apply the CHG Soap to your body ONLY FROM THE NECK DOWN.  Do not use on open wounds or open sores.  Avoid contact with your eyes, ears, mouth and genitals (private parts).  Wash genitals (private parts) with your normal soap.  6.  Wash thoroughly, paying special attention to the area where your surgery will be performed.  7.  Thoroughly rinse your body with warm water from the neck down.  8.  DO NOT shower/wash with your normal soap after using and rinsing off the CHG Soap.  9.  Pat yourself dry with a clean towel.            10.  Wear clean pajamas.            11.  Place clean sheets on your bed the night of your first shower and do not sleep with pets.  Day of Surgery  Do not apply  any lotions the morning of surgery.  Please wear clean clothes to the hospital/surgery center.  Please read over the following fact sheets that you were given. Pain Booklet, Coughing and Deep Breathing and Surgical Site Infection Prevention

## 2015-05-29 NOTE — Progress Notes (Signed)
Anesthesia Chart Review: Patient is a 54 year old female scheduled for removal of bilateral palatal tori on 05/31/15 by Dr. Barbette MerinoJensen.  History includes GERD, smoking, cervical fusion. [facility].    Meds include ASA, ibuprofen, MVI.  05/28/15 EKG: NSR, possible anterior infarct (age undetermined). No comparison tracing in Epic or Muse. Gets occasional sharp pain in chest (feels like she has something "clogged in her throat") ONLY when she drinks a soda. Feels like GERD. Denied exertional chest pain. Is able to walk ~ 3 flights of stairs before she will feel SOB, but no chest pain. Denied prior cardiac testing.   She did not meet anesthesia criteria for pre-operative labs. If surgeon wants any lab studies then would have to get on the day of surgery.   No exertional chest pain. No significant T wave abnormality on EKG. Further evaluation on the day of surgery, but if no acute changes then I would anticipate that she could proceed as planned.   Morgan Ochsllison Sahirah Rudell, PA-C [facility]/Anesthesiology Phone [phone] 05/29/2015 3:43 PM

## 2015-05-30 MED ORDER — CEFAZOLIN SODIUM-DEXTROSE 2-3 GM-% IV SOLR
2.0000 g | INTRAVENOUS | Status: AC
  Administered 2015-05-31: 2 g via INTRAVENOUS
  Filled 2015-05-30: qty 50

## 2015-05-31 ENCOUNTER — Encounter (HOSPITAL_COMMUNITY)
Admission: RE | Disposition: A | Discharge status: Home / Self Care | Payer: Self-pay | Source: Ambulatory Visit | Attending: Oral Surgery

## 2015-05-31 ENCOUNTER — Ambulatory Visit (HOSPITAL_COMMUNITY): Payer: Medicaid Other | Admitting: Certified Registered Nurse Anesthetist

## 2015-05-31 ENCOUNTER — Ambulatory Visit (HOSPITAL_COMMUNITY)
Admission: RE | Admit: 2015-05-31 | Discharge: 2015-05-31 | Disposition: A | Discharge status: Home / Self Care | Payer: Medicaid Other | Source: Ambulatory Visit | Attending: Oral Surgery | Admitting: Oral Surgery

## 2015-05-31 ENCOUNTER — Ambulatory Visit (HOSPITAL_COMMUNITY): Payer: Medicaid Other | Admitting: Vascular Surgery

## 2015-05-31 ENCOUNTER — Encounter (HOSPITAL_COMMUNITY): Payer: Self-pay | Admitting: *Deleted

## 2015-05-31 DIAGNOSIS — M8978 Major osseous defect, other site: Secondary | ICD-10-CM | POA: Insufficient documentation

## 2015-05-31 DIAGNOSIS — M278 Other specified diseases of jaws: Secondary | ICD-10-CM | POA: Diagnosis not present

## 2015-05-31 DIAGNOSIS — S0281XA Fracture of other specified skull and facial bones, right side, initial encounter for closed fracture: Secondary | ICD-10-CM | POA: Diagnosis not present

## 2015-05-31 DIAGNOSIS — Z7982 Long term (current) use of aspirin: Secondary | ICD-10-CM | POA: Insufficient documentation

## 2015-05-31 DIAGNOSIS — Y828 Other medical devices associated with adverse incidents: Secondary | ICD-10-CM | POA: Diagnosis not present

## 2015-05-31 DIAGNOSIS — Y838 Other surgical procedures as the cause of abnormal reaction of the patient, or of later complication, without mention of misadventure at the time of the procedure: Secondary | ICD-10-CM | POA: Diagnosis not present

## 2015-05-31 DIAGNOSIS — F209 Schizophrenia, unspecified: Secondary | ICD-10-CM | POA: Diagnosis not present

## 2015-05-31 DIAGNOSIS — K219 Gastro-esophageal reflux disease without esophagitis: Secondary | ICD-10-CM | POA: Insufficient documentation

## 2015-05-31 DIAGNOSIS — Y9389 Activity, other specified: Secondary | ICD-10-CM | POA: Insufficient documentation

## 2015-05-31 DIAGNOSIS — F17210 Nicotine dependence, cigarettes, uncomplicated: Secondary | ICD-10-CM | POA: Insufficient documentation

## 2015-05-31 DIAGNOSIS — Y998 Other external cause status: Secondary | ICD-10-CM | POA: Insufficient documentation

## 2015-05-31 DIAGNOSIS — M270 Developmental disorders of jaws: Secondary | ICD-10-CM | POA: Diagnosis present

## 2015-05-31 DIAGNOSIS — Y92234 Operating room of hospital as the place of occurrence of the external cause: Secondary | ICD-10-CM | POA: Diagnosis not present

## 2015-05-31 HISTORY — PX: SUBLINGUAL SALIVARY CYST EXCISION: SHX2454

## 2015-05-31 SURGERY — EXCISION, CYST, SUBLINGUAL GLAND
Anesthesia: General | Site: Mouth | Laterality: Bilateral

## 2015-05-31 MED ORDER — SODIUM CHLORIDE 0.9 % IR SOLN
Status: DC | PRN
  Administered 2015-05-31: 1000 mL

## 2015-05-31 MED ORDER — ONDANSETRON HCL 4 MG/2ML IJ SOLN
4.0000 mg | Freq: Once | INTRAMUSCULAR | Status: AC
  Administered 2015-05-31: 4 mg via INTRAVENOUS

## 2015-05-31 MED ORDER — LIDOCAINE-EPINEPHRINE 2 %-1:100000 IJ SOLN
INTRAMUSCULAR | Status: DC | PRN
  Administered 2015-05-31: 20 mL via INTRADERMAL

## 2015-05-31 MED ORDER — PROPOFOL 10 MG/ML IV BOLUS
INTRAVENOUS | Status: AC
  Filled 2015-05-31: qty 20

## 2015-05-31 MED ORDER — OXYCODONE HCL 5 MG/5ML PO SOLN
ORAL | Status: AC
  Filled 2015-05-31: qty 5

## 2015-05-31 MED ORDER — EPHEDRINE SULFATE 50 MG/ML IJ SOLN
INTRAMUSCULAR | Status: DC | PRN
  Administered 2015-05-31: 5 mg via INTRAVENOUS

## 2015-05-31 MED ORDER — MIDAZOLAM HCL 2 MG/2ML IJ SOLN
INTRAMUSCULAR | Status: AC
  Filled 2015-05-31: qty 2

## 2015-05-31 MED ORDER — PROPOFOL 10 MG/ML IV BOLUS
INTRAVENOUS | Status: DC | PRN
  Administered 2015-05-31: 160 mg via INTRAVENOUS
  Administered 2015-05-31: 40 mg via INTRAVENOUS

## 2015-05-31 MED ORDER — NEOSTIGMINE METHYLSULFATE 10 MG/10ML IV SOLN
INTRAVENOUS | Status: DC | PRN
  Administered 2015-05-31: 4 mg via INTRAVENOUS

## 2015-05-31 MED ORDER — FENTANYL CITRATE (PF) 250 MCG/5ML IJ SOLN
INTRAMUSCULAR | Status: DC | PRN
  Administered 2015-05-31: 100 ug via INTRAVENOUS
  Administered 2015-05-31: 50 ug via INTRAVENOUS
  Administered 2015-05-31: 100 ug via INTRAVENOUS
  Administered 2015-05-31: 50 ug via INTRAVENOUS

## 2015-05-31 MED ORDER — OXYCODONE HCL 5 MG/5ML PO SOLN: 5.0000 mg | Freq: Once | ORAL | Status: DC | PRN

## 2015-05-31 MED ORDER — LIDOCAINE-EPINEPHRINE 2 %-1:100000 IJ SOLN
INTRAMUSCULAR | Status: AC
  Filled 2015-05-31: qty 1

## 2015-05-31 MED ORDER — ACETAMINOPHEN 160 MG/5ML PO SOLN
325.0000 mg | ORAL | Status: DC | PRN
  Filled 2015-05-31: qty 20.3

## 2015-05-31 MED ORDER — FENTANYL CITRATE (PF) 250 MCG/5ML IJ SOLN
INTRAMUSCULAR | Status: AC
  Filled 2015-05-31: qty 5

## 2015-05-31 MED ORDER — LACTATED RINGERS IV SOLN
INTRAVENOUS | Status: DC
  Administered 2015-05-31: 07:00:00 via INTRAVENOUS

## 2015-05-31 MED ORDER — MIDAZOLAM HCL 5 MG/5ML IJ SOLN
INTRAMUSCULAR | Status: DC | PRN
  Administered 2015-05-31 (×2): 1 mg via INTRAVENOUS

## 2015-05-31 MED ORDER — OXYCODONE HCL 5 MG PO TABS: 5.0000 mg | ORAL_TABLET | Freq: Once | ORAL | Status: DC | PRN

## 2015-05-31 MED ORDER — GLYCOPYRROLATE 0.2 MG/ML IJ SOLN
INTRAMUSCULAR | Status: DC | PRN
  Administered 2015-05-31: 0.6 mg via INTRAVENOUS

## 2015-05-31 MED ORDER — OXYCODONE-ACETAMINOPHEN 5-325 MG PO TABS: 1.0000 | ORAL_TABLET | ORAL | Status: AC | PRN

## 2015-05-31 MED ORDER — LACTATED RINGERS IV SOLN
INTRAVENOUS | Status: DC | PRN
  Administered 2015-05-31 (×2): via INTRAVENOUS

## 2015-05-31 MED ORDER — PHENYLEPHRINE HCL 10 MG/ML IJ SOLN
INTRAMUSCULAR | Status: DC | PRN
  Administered 2015-05-31 (×4): 40 ug via INTRAVENOUS

## 2015-05-31 MED ORDER — ONDANSETRON HCL 4 MG/2ML IJ SOLN
INTRAMUSCULAR | Status: DC | PRN
  Administered 2015-05-31: 4 mg via INTRAVENOUS

## 2015-05-31 MED ORDER — ONDANSETRON HCL 4 MG/2ML IJ SOLN
INTRAMUSCULAR | Status: AC
Start: 2015-05-31 — End: 2015-05-31
  Administered 2015-05-31: 4 mg via INTRAVENOUS
  Filled 2015-05-31: qty 2

## 2015-05-31 MED ORDER — FENTANYL CITRATE (PF) 100 MCG/2ML IJ SOLN
25.0000 ug | INTRAMUSCULAR | Status: DC | PRN
  Administered 2015-05-31: 50 ug via INTRAVENOUS

## 2015-05-31 MED ORDER — OXYMETAZOLINE HCL 0.05 % NA SOLN
NASAL | Status: DC | PRN
  Administered 2015-05-31: 4 via NASAL

## 2015-05-31 MED ORDER — ACETAMINOPHEN 325 MG PO TABS: 325.0000 mg | ORAL_TABLET | ORAL | Status: DC | PRN

## 2015-05-31 MED ORDER — 0.9 % SODIUM CHLORIDE (POUR BTL) OPTIME
TOPICAL | Status: DC | PRN
  Administered 2015-05-31: 1000 mL

## 2015-05-31 MED ORDER — FENTANYL CITRATE (PF) 100 MCG/2ML IJ SOLN
INTRAMUSCULAR | Status: AC
  Filled 2015-05-31: qty 2

## 2015-05-31 MED ORDER — ROCURONIUM BROMIDE 100 MG/10ML IV SOLN
INTRAVENOUS | Status: DC | PRN
  Administered 2015-05-31: 40 mg via INTRAVENOUS

## 2015-05-31 MED ORDER — OXYMETAZOLINE HCL 0.05 % NA SOLN
NASAL | Status: AC
  Filled 2015-05-31: qty 15

## 2015-05-31 SURGICAL SUPPLY — 35 items
BUR CROSS CUT FISSURE 1.6 (BURR) ×1 IMPLANT
BUR CROSS CUT FISSURE 1.6MM (BURR) ×1
BUR EGG ELITE 4.0 (BURR) ×1 IMPLANT
BUR EGG ELITE 4.0MM (BURR) ×1
CANISTER SUCTION 2500CC (MISCELLANEOUS) ×3 IMPLANT
COVER SURGICAL LIGHT HANDLE (MISCELLANEOUS) IMPLANT
CRADLE DONUT ADULT HEAD (MISCELLANEOUS) IMPLANT
DECANTER SPIKE VIAL GLASS SM (MISCELLANEOUS) ×2 IMPLANT
FLUID NSS /IRRIG 1000 ML XXX (MISCELLANEOUS) ×2 IMPLANT
GAUZE PACKING FOLDED 2  STR (GAUZE/BANDAGES/DRESSINGS) ×2
GAUZE PACKING FOLDED 2 STR (GAUZE/BANDAGES/DRESSINGS) IMPLANT
GLOVE BIO SURGEON STRL SZ 6.5 (GLOVE) ×1 IMPLANT
GLOVE BIO SURGEON STRL SZ7.5 (GLOVE) ×2 IMPLANT
GLOVE BIO SURGEONS STRL SZ 6.5 (GLOVE) ×1
GLOVE BIOGEL PI IND STRL 7.5 (GLOVE) IMPLANT
GLOVE BIOGEL PI INDICATOR 7.5 (GLOVE) ×2
GOWN STRL REUS W/ TWL LRG LVL3 (GOWN DISPOSABLE) IMPLANT
GOWN STRL REUS W/ TWL XL LVL3 (GOWN DISPOSABLE) IMPLANT
GOWN STRL REUS W/TWL LRG LVL3 (GOWN DISPOSABLE) ×6
GOWN STRL REUS W/TWL XL LVL3 (GOWN DISPOSABLE) ×3
KIT BASIN OR (CUSTOM PROCEDURE TRAY) IMPLANT
KIT ROOM TURNOVER OR (KITS) ×3 IMPLANT
NEEDLE 22X1 1/2 (OR ONLY) (NEEDLE) ×4 IMPLANT
NS IRRIG 1000ML POUR BTL (IV SOLUTION) IMPLANT
PAD ARMBOARD 7.5X6 YLW CONV (MISCELLANEOUS) ×6 IMPLANT
PLATE LOCK LONG Y 1.0 (Plate) ×2 IMPLANT
SCREW NON LOCK HT X-DR 1.5X4 (Screw) ×4 IMPLANT
SCREW NON LOCK HT X-DR 1.5X5 (Screw) ×6 IMPLANT
SUT CHROMIC 3 0 PS 2 (SUTURE) ×4 IMPLANT
SYR CONTROL 10ML LL (SYRINGE) ×4 IMPLANT
TOWEL OR 17X24 6PK STRL BLUE (TOWEL DISPOSABLE) ×3 IMPLANT
TOWEL OR 17X26 10 PK STRL BLUE (TOWEL DISPOSABLE) ×1 IMPLANT
TRAY ENT MC OR (CUSTOM PROCEDURE TRAY) ×2 IMPLANT
TUBING IRRIGATION (MISCELLANEOUS) ×2 IMPLANT
YANKAUER SUCT BULB TIP NO VENT (SUCTIONS) ×2 IMPLANT

## 2015-05-31 NOTE — Anesthesia Postprocedure Evaluation (Signed)
  Anesthesia Post-op Note  Patient: Morgan Obrien  Procedure(s) Performed: Procedure(s): REMOVAL OF BILATERAL PALATAL TORI, BUCCAL EXOSTOSES RIGHT AND LEFT MAXILLA, AND HYPERPLASTIC MAXILLARY OSSEOUS TUBEROSITIES and ORIF of palatal fracture (Bilateral)  Patient Location: PACU  Anesthesia Type:General  Level of Consciousness: awake  Airway and Oxygen Therapy: Patient Spontanous Breathing  Post-op Pain: mild  Post-op Assessment: Post-op Vital signs reviewed, Patient's Cardiovascular Status Stable, Respiratory Function Stable, Patent Airway, No signs of Nausea or vomiting and Pain level controlled              Post-op Vital Signs: Reviewed and stable  Last Vitals:  Filed Vitals:   05/31/15 1345  BP: 166/87  Pulse: 78  Temp:   Resp: 14    Complications: No apparent anesthesia complications

## 2015-05-31 NOTE — Anesthesia Preprocedure Evaluation (Signed)
Anesthesia Evaluation  Patient identified by MRN, date of birth, ID band Patient awake    Reviewed: Allergy & Precautions, NPO status , Patient's Chart, lab work & pertinent test results  History of Anesthesia Complications Negative for: history of anesthetic complications  Airway Mallampati: II  TM Distance: >3 FB Neck ROM: Full    Dental  (+) Missing   Pulmonary neg shortness of breath, neg sleep apnea, neg COPD, neg recent URI, Current Smoker,    breath sounds clear to auscultation       Cardiovascular negative cardio ROS   Rhythm:Regular     Neuro/Psych  Headaches, Schizophrenia  Neuromuscular disease    GI/Hepatic negative GI ROS, Neg liver ROS,   Endo/Other  negative endocrine ROS  Renal/GU negative Renal ROS     Musculoskeletal   Abdominal   Peds  Hematology negative hematology ROS (+)   Anesthesia Other Findings   Reproductive/Obstetrics                             Anesthesia Physical Anesthesia Plan  ASA: II  Anesthesia Plan: General   Post-op Pain Management:    Induction: Intravenous  Airway Management Planned: Nasal ETT  Additional Equipment: None  Intra-op Plan:   Post-operative Plan: Extubation in OR  Informed Consent: I have reviewed the patients History and Physical, chart, labs and discussed the procedure including the risks, benefits and alternatives for the proposed anesthesia with the patient or authorized representative who has indicated his/her understanding and acceptance.   Dental advisory given  Plan Discussed with: CRNA and Surgeon  Anesthesia Plan Comments:         Anesthesia Quick Evaluation

## 2015-05-31 NOTE — Progress Notes (Signed)
Mouth rinse with warm salt water. Then sips of ginger ale tolerated. Oral bite pads replaced." Feeling better and ready to go home"

## 2015-05-31 NOTE — H&P (Signed)
H&P documentation  -History and Physical Reviewed  -Patient has been re-examined  -No change in the plan of care  Morgan Obrien  

## 2015-05-31 NOTE — Op Note (Signed)
05/31/2015  11:05 AM  PATIENT:  Morgan Obrien  54 y.o. female  PRE-OPERATIVE DIAGNOSIS:  PALATAL TORI, BUCCAL EXOSTOSES RIGHT AND LEFT MAXILLA, AND HYPERPLASTIC MAXILLARY OSSEOUS TUBEROSITIES   POST-OPERATIVE DIAGNOSIS:  SAME  PROCEDURE:  Procedure(s): REMOVAL OF PALATAL TORUS,RE MOVAL BUCCAL EXOSTOSES RIGHT AND LEFT MAXILLA, AND REDUCTION HYPERPLASTIC MAXILLARY OSSEOUS TUBEROSITIES  OPEN REDUCTION INTERNAL (ORIF) FIXATION PALATAL fracture  SURGEON:  Surgeon(s): Ocie DoyneScott Carra Brindley, DDS  ANESTHESIA:   local and general  EBL:  minimal  DRAINS: none   COMPLICATIONS: PALATAL FRACTURE  SPECIMEN:  No Specimen  COUNTS:  YES  PLAN OF CARE: Discharge to home after PACU  PATIENT DISPOSITION:  PACU - hemodynamically stable.   PROCEDURE DETAILS: Dictation # [id number]  Morgan Obrien, DMD 05/31/2015 11:05 AM

## 2015-05-31 NOTE — Transfer of Care (Signed)
Immediate Anesthesia Transfer of Care Note  Patient: Cherene JulianRegina D Moree  Procedure(s) Performed: Procedure(s): REMOVAL OF BILATERAL PALATAL TORI, BUCCAL EXOSTOSES RIGHT AND LEFT MAXILLA, AND HYPERPLASTIC MAXILLARY OSSEOUS TUBEROSITIES and ORIF of palatal fracture (Bilateral)  Patient Location: PACU  Anesthesia Type:General  Level of Consciousness: awake, alert , patient cooperative and responds to stimulation  Airway & Oxygen Therapy: Patient Spontanous Breathing  Post-op Assessment: Report given to RN, Post -op Vital signs reviewed and stable and Patient moving all extremities X 4  Post vital signs: Reviewed and stable  Last Vitals:  Filed Vitals:   05/31/15 0827  BP: 133/91  Pulse: 74  Temp: 36.8 C  Resp: 20    Complications: No apparent anesthesia complications

## 2015-05-31 NOTE — Anesthesia Procedure Notes (Signed)
Procedure Name: Intubation Date/Time: 05/31/2015 9:29 AM Performed by: Virgel GessHOLTZMAN, Abdulhadi Stopa LEFFEW Pre-anesthesia Checklist: Patient identified, Patient being monitored, Timeout performed, Emergency Drugs available and Suction available Patient Re-evaluated:Patient Re-evaluated prior to inductionOxygen Delivery Method: Circle System Utilized Preoxygenation: Pre-oxygenation with 100% oxygen Intubation Type: IV induction Ventilation: Mask ventilation without difficulty Laryngoscope Size: Mac and 3 Grade View: Grade I Nasal Tubes: Right Tube size: 7.0 mm Number of attempts: 1 Placement Confirmation: ETT inserted through vocal cords under direct vision,  positive ETCO2 and breath sounds checked- equal and bilateral Tube secured with: Tape Dental Injury: Teeth and Oropharynx as per pre-operative assessment

## 2015-06-01 NOTE — Op Note (Signed)
NAME:  Morgan Obrien, Morgan Obrien             ACCOUNT NO.:  [id number]  MEDICAL RECORD NO.:  [id number]  LOCATION:                               FACILITY:  [facility]  PHYSICIAN:  Georgia Lopes, M.D.  DATE OF BIRTH:  05/14/1961  DATE OF PROCEDURE:  05/31/2015 DATE OF DISCHARGE:  05/31/2015                              OPERATIVE REPORT   PREOPERATIVE DIAGNOSES:  Maxillary palatal torus.  Bilateral maxillary buccal exostoses.  Hyperplastic maxillary osseous tuberosities.  POSTOPERATIVE DIAGNOSES:  Maxillary palatal torus.  Bilateral maxillary buccal exostoses. Hyperplastic maxillary osseous tuberosities, fracture transverse of palate.  PROCEDURE:  Removal of palatal torus, removal of buccal exostoses right and left maxilla, reduction of hyperplastic maxillary osseous tuberosities, open reduction and internal fixation palatal fracture.  SURGEON:  Georgia Lopes, M.D.  ANESTHESIA:  Drucilla Chalet attending, nasal intubation.  DESCRIPTION OF PROCEDURE:  The patient was taken to the operating room, placed on the table in supine position.  General anesthesia was administered intravenously and a nasal endotracheal tube was placed and secured atraumatically. The eyes were protected, and the patient was draped for the procedure.  Time-out was performed.  The posterior pharynx was suctioned.  A throat pack was placed.  A 2% lidocaine with 1:100,000 epinephrine was infiltrated both palatally and buccally and in the midportion of the maxillary torus.  A bite block was placed in the right side of the mouth.  The left side was operated first.  A #15 blade was used to make a crystal incision in the posterior maxilla overlying the hyperplastic maxillary left tuberosity.  The periosteum was reflected in this area.  The incision was carried down anteriorly to the canine area where the canine tooth was encountered.  Then, the periosteum was reflected palatally and buccally in the posterior maxilla.  An  egg-shaped bur and bone file were used to reduce the hyperplastic tuberosity and to reduce the exostosis.  Then, the bite block was repositioned on the other side of the mouth, then, a similar incision was made on the left side of the alveolar crest and carrying forward to the canine region.  The periosteum was reflected buccally and palatally, and the right posterior maxilla and the hyperplastic osseous tuberosity was reduced both buccally and palatally, as well as the exostosis was reduced buccally.  The incision was carried across the midline and the palatal tissue was flapped downward until the palatal torus was encountered.  An osteotome and mallet were used to attempt to cleave the palatal torus from the palatal bone.  However, in doing so, the palate fractured transversely, and then the Stryker handpiece was used with the fissure bur and the egg bur to reduce the palatal torus, and then the palatal bone was placed into anatomical position and held there with skin hooks, and a 4-hole bone plate was adapted with self- tapping screws to reduce and stabilize the palatal fracture.  Then, the maxillary bone and tissue were irrigated, suctioned, and closed with 3-0 chromic in horizontal mattress sutures.  The oral cavity was then inspected and irrigated and suctioned.  Throat pack was removed.  The patient was awakened and taken to the recovery room breathing spontaneously in  good condition.  ESTIMATED BLOOD LOSS:  Minimal.  COMPLICATIONS:  Palatal fracture.  SPECIMENS:  None.  COUNTS:  Correct.     Georgia LopesScott M. Aliea Bobe, M.D.     SMJ/MEDQ  D:  05/31/2015  T:  06/01/2015  Job:  [id number]

## 2015-06-03 ENCOUNTER — Encounter (HOSPITAL_COMMUNITY): Payer: Self-pay | Admitting: Oral Surgery

## 2015-06-11 ENCOUNTER — Encounter (HOSPITAL_COMMUNITY): Payer: Self-pay | Admitting: Oral Surgery

## 2015-12-04 ENCOUNTER — Other Ambulatory Visit: Payer: Self-pay

## 2015-12-04 DIAGNOSIS — Z1231 Encounter for screening mammogram for malignant neoplasm of breast: Secondary | ICD-10-CM

## 2015-12-18 ENCOUNTER — Ambulatory Visit: Payer: Medicaid Other

## 2016-01-03 ENCOUNTER — Ambulatory Visit
Admission: RE | Admit: 2016-01-03 | Discharge: 2016-01-03 | Disposition: A | Discharge status: Home / Self Care | Payer: Medicaid Other | Source: Ambulatory Visit

## 2016-01-03 ENCOUNTER — Ambulatory Visit: Payer: Medicaid Other

## 2016-01-03 ENCOUNTER — Other Ambulatory Visit: Payer: Self-pay | Admitting: Physician Assistant

## 2016-01-03 DIAGNOSIS — Z1231 Encounter for screening mammogram for malignant neoplasm of breast: Secondary | ICD-10-CM

## 2016-01-07 ENCOUNTER — Other Ambulatory Visit: Payer: Self-pay | Admitting: Physician Assistant

## 2016-01-07 DIAGNOSIS — R928 Other abnormal and inconclusive findings on diagnostic imaging of breast: Secondary | ICD-10-CM

## 2016-01-13 ENCOUNTER — Ambulatory Visit: Payer: Medicaid Other

## 2016-01-21 ENCOUNTER — Ambulatory Visit
Admission: RE | Admit: 2016-01-21 | Discharge: 2016-01-21 | Disposition: A | Discharge status: Home / Self Care | Payer: Medicaid Other | Source: Ambulatory Visit | Attending: Physician Assistant | Admitting: Physician Assistant

## 2016-01-21 DIAGNOSIS — R928 Other abnormal and inconclusive findings on diagnostic imaging of breast: Secondary | ICD-10-CM

## 2016-01-29 ENCOUNTER — Telehealth: Payer: Self-pay | Admitting: General Practice

## 2016-01-29 NOTE — Telephone Encounter (Signed)
Opened in error

## 2017-03-17 ENCOUNTER — Other Ambulatory Visit: Payer: Self-pay | Admitting: Physician Assistant

## 2017-03-17 DIAGNOSIS — Z1231 Encounter for screening mammogram for malignant neoplasm of breast: Secondary | ICD-10-CM

## 2017-04-06 ENCOUNTER — Ambulatory Visit: Payer: Self-pay

## 2017-04-09 ENCOUNTER — Ambulatory Visit: Payer: Self-pay

## 2017-04-23 ENCOUNTER — Ambulatory Visit: Payer: Self-pay

## 2017-05-04 ENCOUNTER — Ambulatory Visit
Admission: RE | Admit: 2017-05-04 | Discharge: 2017-05-04 | Disposition: A | Discharge status: Home / Self Care | Payer: Medicaid Other | Source: Ambulatory Visit | Attending: Physician Assistant | Admitting: Physician Assistant

## 2017-05-04 DIAGNOSIS — Z1231 Encounter for screening mammogram for malignant neoplasm of breast: Secondary | ICD-10-CM

## 2018-06-28 ENCOUNTER — Encounter: Payer: Self-pay | Admitting: Gastroenterology

## 2018-07-01 ENCOUNTER — Encounter: Payer: Self-pay | Admitting: Gastroenterology

## 2018-08-03 ENCOUNTER — Encounter: Payer: Medicaid Other | Admitting: Gastroenterology

## 2018-09-27 ENCOUNTER — Encounter: Payer: Self-pay | Admitting: Gastroenterology

## 2019-02-05 ENCOUNTER — Other Ambulatory Visit: Payer: Self-pay

## 2019-02-05 ENCOUNTER — Emergency Department (HOSPITAL_COMMUNITY)
Admission: EM | Admit: 2019-02-05 | Discharge: 2019-02-05 | Disposition: A | Discharge status: Home / Self Care | Payer: Medicaid Other | Attending: Emergency Medicine | Admitting: Emergency Medicine

## 2019-02-05 ENCOUNTER — Encounter (HOSPITAL_COMMUNITY): Payer: Self-pay | Admitting: Emergency Medicine

## 2019-02-05 DIAGNOSIS — F419 Anxiety disorder, unspecified: Secondary | ICD-10-CM | POA: Insufficient documentation

## 2019-02-05 DIAGNOSIS — F17200 Nicotine dependence, unspecified, uncomplicated: Secondary | ICD-10-CM | POA: Insufficient documentation

## 2019-02-05 DIAGNOSIS — Z79899 Other long term (current) drug therapy: Secondary | ICD-10-CM | POA: Insufficient documentation

## 2019-02-05 DIAGNOSIS — R000 Tachycardia, unspecified: Secondary | ICD-10-CM

## 2019-02-05 DIAGNOSIS — R42 Dizziness and giddiness: Secondary | ICD-10-CM | POA: Diagnosis present

## 2019-02-05 DIAGNOSIS — E876 Hypokalemia: Secondary | ICD-10-CM | POA: Diagnosis not present

## 2019-02-05 LAB — CBC
HCT: 39.9 % (ref 36.0–46.0)
Hemoglobin: 12.9 g/dL (ref 12.0–15.0)
MCH: 29.7 pg (ref 26.0–34.0)
MCHC: 32.3 g/dL (ref 30.0–36.0)
MCV: 91.9 fL (ref 80.0–100.0)
Platelets: 331 10*3/uL (ref 150–400)
RBC: 4.34 MIL/uL (ref 3.87–5.11)
RDW: 14.2 % (ref 11.5–15.5)
WBC: 10.8 10*3/uL — ABNORMAL HIGH (ref 4.0–10.5)
nRBC: 0 % (ref 0.0–0.2)

## 2019-02-05 LAB — BASIC METABOLIC PANEL
Anion gap: 15 (ref 5–15)
BUN: 10 mg/dL (ref 6–20)
CO2: 20 mmol/L — ABNORMAL LOW (ref 22–32)
Calcium: 9.6 mg/dL (ref 8.9–10.3)
Chloride: 107 mmol/L (ref 98–111)
Creatinine, Ser: 0.9 mg/dL (ref 0.44–1.00)
GFR calc Af Amer: >60 mL/min (ref >60)
GFR calc non Af Amer: >60 mL/min (ref >60)
Glucose, Bld: 121 mg/dL — ABNORMAL HIGH (ref 70–99)
Potassium: 2.7 mmol/L — CL (ref 3.5–5.1)
Sodium: 142 mmol/L (ref 135–145)

## 2019-02-05 LAB — TSH: TSH: 1.415 u[IU]/mL (ref 0.350–4.500)

## 2019-02-05 LAB — MAGNESIUM: Magnesium: 1.9 mg/dL (ref 1.7–2.4)

## 2019-02-05 MED ORDER — POTASSIUM CHLORIDE CRYS ER 20 MEQ PO TBCR: 20.0000 meq | EXTENDED_RELEASE_TABLET | Freq: Two times a day (BID) | ORAL | 0 refills | Status: AC

## 2019-02-05 MED ORDER — POTASSIUM CHLORIDE CRYS ER 20 MEQ PO TBCR
60.0000 meq | EXTENDED_RELEASE_TABLET | Freq: Once | ORAL | Status: AC
  Administered 2019-02-05: 60 meq via ORAL
  Filled 2019-02-05: qty 3

## 2019-02-05 MED ORDER — LORAZEPAM 2 MG/ML IJ SOLN
0.5000 mg | Freq: Once | INTRAMUSCULAR | Status: AC
  Administered 2019-02-05: 0.5 mg via INTRAVENOUS
  Filled 2019-02-05: qty 1

## 2019-02-05 MED ORDER — SODIUM CHLORIDE 0.9 % IV BOLUS
1000.0000 mL | Freq: Once | INTRAVENOUS | Status: AC
  Administered 2019-02-05: 1000 mL via INTRAVENOUS

## 2019-02-05 MED ORDER — POTASSIUM CHLORIDE 10 MEQ/100ML IV SOLN
10.0000 meq | Freq: Once | INTRAVENOUS | Status: AC
  Administered 2019-02-05: 10 meq via INTRAVENOUS
  Filled 2019-02-05: qty 100

## 2019-02-05 MED ORDER — SODIUM CHLORIDE 0.9 % IV BOLUS
500.0000 mL | Freq: Once | INTRAVENOUS | Status: AC
  Administered 2019-02-05: 500 mL via INTRAVENOUS

## 2019-02-05 NOTE — ED Provider Notes (Signed)
  Physical Exam  BP 115/70   Pulse (!) 123   Temp 98.8 F (37.1 C) (Oral)   Resp (!) 22   Ht 5\' 6"  (1.676 m)   Wt 68 kg   SpO2 100%   BMI 24.21 kg/m   Physical Exam Vitals signs and nursing note reviewed.  Constitutional:      General: She is not in acute distress.    Appearance: She is well-developed. She is not diaphoretic.  HENT:     Head: Normocephalic and atraumatic.  Eyes:     General: No scleral icterus.    Conjunctiva/sclera: Conjunctivae normal.  Neck:     Musculoskeletal: Normal range of motion.  Pulmonary:     Effort: Pulmonary effort is normal. No respiratory distress.  Skin:    Findings: No rash.  Neurological:     Mental Status: She is alert.     ED Course/Procedures     Procedures  MDM  Care handed off from previous provider PA Geiple.  Please see their note for further detail.  Briefly, patient is a 58 year old female with a past medical history of anxiety who presenting to ED with a chief complaint of lightheadedness.  Was in her usual state of health since this morning.  She was at the grocery store when she suddenly felt dizzy and lightheaded.  Patient tachycardic on arrival here.  She denies any chest pain.  Suspicion for panic attack but will need to check TSH and other labs as well give fluids and Ativan for improvement in heart rate.  Labs Reviewed  CBC - Abnormal; Notable for the following components:      Result Value   WBC 10.8 (*)    All other components within normal limits  BASIC METABOLIC PANEL - Abnormal; Notable for the following components:   Potassium 2.7 (*)    CO2 20 (*)    Glucose, Bld 121 (*)    All other components within normal limits  TSH  MAGNESIUM   Lab work shows hypokalemia of 2.7.  This was repleted IV and orally.  Patient's heart rate improved with fluids and Ativan.  She reports significant improvement in her symptoms.  TSH is within normal limits.  CBC and magnesium unremarkable.  Will be discharged home with p.o.  potassium and PCP follow-up for recheck.   Patient is hemodynamically stable, in NAD, and able to ambulate in the ED. Evaluation does not show pathology that would require ongoing emergent intervention or inpatient treatment. I explained the diagnosis to the patient. Pain has been managed and has no complaints prior to discharge. Patient is comfortable with above plan and is stable for discharge at this time. All questions were answered prior to disposition. Strict return precautions for returning to the ED were discussed. Encouraged follow up with PCP.   An After Visit Summary was printed and given to the patient.   Portions of this note were generated with Lobbyist. Dictation errors may occur despite best attempts at proofreading.         Delia Heady, PA-C 02/05/19 1731    Sherwood Gambler, MD 02/09/19 [phone]

## 2019-02-05 NOTE — ED Triage Notes (Signed)
Pt arrives to ED from the grocery store with complaints of a near syncopal episode. Patient stated that she got suddenly dizzy and lightheaded. Pt states she has hx of panic attacks and this feels like one. Pt states many stressors in her life at this time.

## 2019-02-05 NOTE — Discharge Instructions (Addendum)
Please read and follow all provided instructions.  Your diagnoses today include:  1. Tachycardia   2. Anxiety     Tests performed today include: Blood counts and electrolytes - low potassium Thyroid test EKG - showed fast heart rate Vital signs. See below for your results today.   Home care instructions:  Follow any educational materials contained in this packet.  Follow-up instructions: Please follow-up with your primary care provider in the next 3 days for further evaluation of your symptoms.   Return instructions:  Please return to the Emergency Department if you experience worsening symptoms.  Return if you have additional episodes of passing out, if you develop chest pain or shortness of breath Please return if you have any other emergent concerns.  Additional Information:  Your vital signs today were: BP 115/70    Pulse 98    Temp 98.8 F (37.1 C) (Oral)    Resp 19    Ht 5\' 6"  (1.676 m)    Wt 68 kg    SpO2 96%    BMI 24.21 kg/m  If your blood pressure (BP) was elevated above 135/85 this visit, please have this repeated by your doctor within one month. --------------

## 2019-02-05 NOTE — ED Notes (Signed)
Patient verbalizes understanding of discharge instructions. Opportunity for questioning and answers were provided. Armband removed by staff, pt discharged from ED.  

## 2019-02-05 NOTE — ED Provider Notes (Addendum)
[facility] Provider Note   CSN: [id number] Arrival date & time: 02/05/19  1354     History   Chief Complaint Chief Complaint  Patient presents with  . Near Syncope  . Anxiety    HPI Morgan Obrien is a 58 y.o. female.     Patient with history of anxiety presents to the emergency department today with complaint of lightheadedness.  Patient states that she awoke this morning feeling normal.  She got into her car to go to the grocery store and she suddenly felt dizzy.  She describes the sensation as feeling lightheaded.  She was able to drive herself to the grocery store where she asked them to call EMS and was transported to the hospital.  She did not have any associated chest pains or shortness of breath.  She denies any abdominal pain, vomiting, or diarrhea recently.  She states that she has been under a lot of stress and did not sleep well last night.  She denies any recent weight changes or hot or cold intolerance.  No history of thyroid problems.  No cardiac history.  Patient denies risk factors for pulmonary embolism including: unilateral leg swelling, history of DVT/PE/other blood clots, use of exogenous hormones, recent immobilizations, recent surgery, recent travel (>4hr segment), malignancy, hemoptysis. Denies drug use.     Past Medical History:  Diagnosis Date  . GERD (gastroesophageal reflux disease)   . Medical history non-contributory     Patient Active Problem List   Diagnosis Date Noted  . HEADACHE 12/24/2009  . SKIN RASH 11/12/2009  . ELEVATED BLOOD PRESSURE WITHOUT DIAGNOSIS OF HYPERTENSION 08/15/2009  . VAGINITIS, CANDIDAL 06/20/2009  . URINALYSIS, ABNORMAL 06/20/2009  . OTHER UNSPECIFIED BACK DISORDER 05/10/2009  . 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 TOBACCO ABUSE 02/20/2009  . PHLEBITIS, LOWER EXTREMITY 11/13/2008  . MYALGIA 02/20/2008  . GERD 01/04/2007  . 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 SEBORRHEIC DERMATITIS  07/26/2002  . HERPETIC WHITLOW 02/15/2002  . SCHIZOAFFECTIVE DISORDER UNSPECIFIED 07/09/1998    Past Surgical History:  Procedure Laterality Date  . CERVICAL FUSION    . SUBLINGUAL SALIVARY CYST EXCISION Bilateral 05/31/2015   Procedure: REMOVAL OF BILATERAL PALATAL TORI, BUCCAL EXOSTOSES RIGHT AND LEFT MAXILLA, AND HYPERPLASTIC MAXILLARY OSSEOUS TUBEROSITIES and ORIF of palatal fracture;  Surgeon: Ocie DoyneScott Jensen, DDS;  Location: [facility] OR;  Service: Oral Surgery;  Laterality: Bilateral;     OB History   No obstetric history on file.      Home Medications    Prior to Admission medications   Medication Sig Start Date End Date Taking? Authorizing Provider  ibuprofen (ADVIL,MOTRIN) 200 MG tablet Take 200 mg by mouth every 6 (six) hours as needed for mild pain.    [provider]  Multiple Vitamin (MULTIVITAMIN WITH MINERALS) TABS tablet Take 1 tablet by mouth daily.    [provider]  oxyCODONE-acetaminophen (PERCOCET) 5-325 MG tablet Take 1-2 tablets by mouth every 4 (four) hours as needed for severe pain. 05/31/15   Ocie DoyneJensen, Scott, DDS    Family History Family History  Problem Relation Age of Onset  . Breast cancer Neg Hx     Social History Social History   Tobacco Use  . Smoking status: Current Every Day Smoker    Packs/day: 0.50    Years: 25.00    Pack years: 12.50  Substance Use Topics  . Alcohol use: No  . Drug use: No     Allergies   Patient has no known  allergies.   Review of Systems Review of Systems  Constitutional: Negative for fever.  HENT: Negative for rhinorrhea and sore throat.   Eyes: Negative for redness.  Respiratory: Negative for cough.   Cardiovascular: Positive for palpitations. Negative for chest pain.  Gastrointestinal: Negative for abdominal pain, diarrhea, nausea and vomiting.  Genitourinary: Negative for dysuria.  Musculoskeletal: Negative for myalgias.  Skin: Negative for rash.  Neurological: Positive for  light-headedness. Negative for syncope and headaches.  Psychiatric/Behavioral: The patient is nervous/anxious.      Physical Exam Updated Vital Signs BP (!) 147/85 (BP Location: Right Arm)   Pulse (!) 123   Temp 98.8 F (37.1 C) (Oral)   Resp (!) 21   Ht 5\' 6"  (1.676 m)   Wt 68 kg   SpO2 100%   BMI 24.21 kg/m   Physical Exam Vitals signs and nursing note reviewed.  Constitutional:      Appearance: She is well-developed. She is not diaphoretic.  HENT:     Head: Normocephalic and atraumatic.     Mouth/Throat:     Mouth: Mucous membranes are not dry.  Eyes:     Conjunctiva/sclera: Conjunctivae normal.  Neck:     Musculoskeletal: Normal range of motion and neck supple. No muscular tenderness.     Vascular: Normal carotid pulses. No carotid bruit or JVD.     Trachea: Trachea normal. No tracheal deviation.  Cardiovascular:     Rate and Rhythm: Regular rhythm. Tachycardia present.     Pulses: No decreased pulses.     Heart sounds: Normal heart sounds, S1 normal and S2 normal. No murmur.  Pulmonary:     Effort: Pulmonary effort is normal. No respiratory distress.     Breath sounds: No wheezing.  Chest:     Chest wall: No tenderness.  Abdominal:     General: Bowel sounds are normal.     Palpations: Abdomen is soft.     Tenderness: There is no abdominal tenderness. There is no guarding or rebound.  Musculoskeletal: Normal range of motion.  Skin:    General: Skin is warm and dry.     Coloration: Skin is not pale.  Neurological:     Mental Status: She is alert.  Psychiatric:        Mood and Affect: Mood is anxious.      ED Treatments / Results  Labs (all labs ordered are listed, but only abnormal results are displayed) Labs Reviewed  CBC - Abnormal; Notable for the following components:      Result Value   WBC 10.8 (*)    All other components within normal limits  BASIC METABOLIC PANEL - Abnormal; Notable for the following components:   Potassium 2.7 (*)    CO2  20 (*)    Glucose, Bld 121 (*)    All other components within normal limits  TSH    EKG EKG Interpretation  Date/Time:  Sunday February 05 2019 14:48:45 EDT Ventricular Rate:  128 PR Interval:    QRS Duration: 84 QT Interval:  294 QTC Calculation: 429 R Axis:   75 Text Interpretation:  Sinus tachycardia Confirmed by Lajean Saver [phone]) on 02/05/2019 2:53:56 PM   Radiology No results found.  Procedures Procedures (including critical care time)  Medications Ordered in ED Medications  sodium chloride 0.9 % bolus 500 mL (500 mLs Intravenous New Bag/Given 02/05/19 1446)  LORazepam (ATIVAN) injection 0.5 mg (0.5 mg Intravenous Given 02/05/19 1445)     Initial Impression / Assessment and Plan /  ED Course  I have reviewed the triage vital signs and the nursing notes.  Pertinent labs & imaging results that were available during my care of the patient were reviewed by me and considered in my medical decision making (see chart for details).        Patient seen and examined. Work-up initiated. Medications ordered.  Overall the patient seems very anxious however she is persistently tachycardic in the 115-120 range.  Will check labs, TSH, give fluids and dose of IV Ativan and reassess.  Vital signs reviewed and are as follows: BP (!) 147/85 (BP Location: Right Arm)   Pulse (!) 123   Temp 98.8 F (37.1 C) (Oral)   Resp (!) 21   Ht 5\' 6"  (1.676 m)   Wt 68 kg   SpO2 100%   BMI 24.21 kg/m   3:00 PM Pending labs. Signout to Duke EnergyKhatri PA-C at shift change.   Pt needs reassessed and lab results checked. If better with fluids and ativan, likely anxiety and can go home.   3:46 PM Potassium 2.7, will replete. 60mEq PO ordered and one run IV. Also will give 2nd liter given + orthostatics.   Final Clinical Impressions(s) / ED Diagnoses   Final diagnoses:  Tachycardia  Anxiety  Hypokalemia   Pending completion of work-up.   ED Discharge Orders    None       Renne CriglerGeiple, Ravina Milner,  PA-C 02/05/19 1502    Renne CriglerGeiple, Vung Kush, PA-C 02/05/19 1550    Cathren LaineSteinl, Kevin, MD 02/06/19 [phone]

## 2019-02-09 ENCOUNTER — Emergency Department (HOSPITAL_COMMUNITY)
Admission: EM | Admit: 2019-02-09 | Discharge: 2019-02-09 | Disposition: A | Discharge status: Home / Self Care | Payer: Medicaid Other | Attending: Emergency Medicine | Admitting: Emergency Medicine

## 2019-02-09 ENCOUNTER — Encounter (HOSPITAL_COMMUNITY): Payer: Self-pay | Admitting: *Deleted

## 2019-02-09 ENCOUNTER — Other Ambulatory Visit: Payer: Self-pay

## 2019-02-09 ENCOUNTER — Ambulatory Visit (HOSPITAL_COMMUNITY)
Admission: EM | Admit: 2019-02-09 | Discharge: 2019-02-09 | Disposition: A | Discharge status: Other Acute Inpatient Hospital | Payer: Medicaid Other

## 2019-02-09 DIAGNOSIS — Z5321 Procedure and treatment not carried out due to patient leaving prior to being seen by health care provider: Secondary | ICD-10-CM | POA: Diagnosis not present

## 2019-02-09 DIAGNOSIS — R42 Dizziness and giddiness: Secondary | ICD-10-CM | POA: Insufficient documentation

## 2019-02-09 LAB — CBC
HCT: 39.8 % (ref 36.0–46.0)
Hemoglobin: 12.7 g/dL (ref 12.0–15.0)
MCH: 29.1 pg (ref 26.0–34.0)
MCHC: 31.9 g/dL (ref 30.0–36.0)
MCV: 91.1 fL (ref 80.0–100.0)
Platelets: 332 10*3/uL (ref 150–400)
RBC: 4.37 MIL/uL (ref 3.87–5.11)
RDW: 14.1 % (ref 11.5–15.5)
WBC: 8.3 10*3/uL (ref 4.0–10.5)
nRBC: 0 % (ref 0.0–0.2)

## 2019-02-09 LAB — BASIC METABOLIC PANEL
Anion gap: 11 (ref 5–15)
BUN: 12 mg/dL (ref 6–20)
CO2: 21 mmol/L — ABNORMAL LOW (ref 22–32)
Calcium: 9.6 mg/dL (ref 8.9–10.3)
Chloride: 107 mmol/L (ref 98–111)
Creatinine, Ser: 0.8 mg/dL (ref 0.44–1.00)
GFR calc Af Amer: >60 mL/min (ref >60)
GFR calc non Af Amer: >60 mL/min (ref >60)
Glucose, Bld: 104 mg/dL — ABNORMAL HIGH (ref 70–99)
Potassium: 4 mmol/L (ref 3.5–5.1)
Sodium: 139 mmol/L (ref 135–145)

## 2019-02-09 LAB — I-STAT BETA HCG BLOOD, ED (MC, WL, AP ONLY): I-stat hCG, quantitative: <5 m[IU]/mL (ref <5)

## 2019-02-09 MED ORDER — SODIUM CHLORIDE 0.9% FLUSH: 3.0000 mL | Freq: Once | INTRAVENOUS | Status: DC

## 2019-02-09 NOTE — ED Notes (Signed)
Pt left AMA. She stated she is going to another hospital or urgent care tomorrow.

## 2019-02-09 NOTE — ED Notes (Signed)
Pt elected to return to ED per her discharge papers stated

## 2019-02-09 NOTE — ED Triage Notes (Signed)
To ED for further eval of dizziness. States she was here Sunday with same symptoms and dx with sinus tach. No meds given. Pt states she is now unable to drive - 'everything just feels funny'. Pt is ambulatory. Appears in nad. Skin w/d. resp e/u. Denies cp, sob, nausea, or vomiting.

## 2019-05-16 ENCOUNTER — Other Ambulatory Visit: Payer: Self-pay

## 2019-05-16 ENCOUNTER — Emergency Department (HOSPITAL_COMMUNITY)
Admission: EM | Admit: 2019-05-16 | Discharge: 2019-05-16 | Disposition: A | Discharge status: Home / Self Care | Payer: Medicaid Other | Attending: Emergency Medicine | Admitting: Emergency Medicine

## 2019-05-16 DIAGNOSIS — Y929 Unspecified place or not applicable: Secondary | ICD-10-CM | POA: Diagnosis not present

## 2019-05-16 DIAGNOSIS — Y999 Unspecified external cause status: Secondary | ICD-10-CM | POA: Insufficient documentation

## 2019-05-16 DIAGNOSIS — Y33XXXA Other specified events, undetermined intent, initial encounter: Secondary | ICD-10-CM | POA: Insufficient documentation

## 2019-05-16 DIAGNOSIS — F17210 Nicotine dependence, cigarettes, uncomplicated: Secondary | ICD-10-CM | POA: Insufficient documentation

## 2019-05-16 DIAGNOSIS — S0592XA Unspecified injury of left eye and orbit, initial encounter: Secondary | ICD-10-CM | POA: Diagnosis present

## 2019-05-16 DIAGNOSIS — S0502XA Injury of conjunctiva and corneal abrasion without foreign body, left eye, initial encounter: Secondary | ICD-10-CM | POA: Diagnosis not present

## 2019-05-16 DIAGNOSIS — Y939 Activity, unspecified: Secondary | ICD-10-CM | POA: Diagnosis not present

## 2019-05-16 MED ORDER — SULFACETAMIDE SODIUM 10 % OP SOLN: 1.0000 [drp] | OPHTHALMIC | 0 refills | Status: AC

## 2019-05-16 MED ORDER — TETRACAINE HCL 0.5 % OP SOLN
2.0000 [drp] | Freq: Once | OPHTHALMIC | Status: AC
  Administered 2019-05-16: 2 [drp] via OPHTHALMIC
  Filled 2019-05-16: qty 4

## 2019-05-16 MED ORDER — FLUORESCEIN SODIUM 1 MG OP STRP
1.0000 | ORAL_STRIP | Freq: Once | OPHTHALMIC | Status: AC
  Administered 2019-05-16: 1 via OPHTHALMIC
  Filled 2019-05-16: qty 1

## 2019-05-16 NOTE — ED Notes (Signed)
Pt does not have her reading glasses with her.

## 2019-05-16 NOTE — ED Triage Notes (Signed)
Pt presents w/eye pain. Pt at nail salon getting eye lashes put on when she began having severe pain, pt reports having eye lashes put on in the past w/no problems. Pt c/o sharp eye pain, flushed by EMS, redness noted

## 2019-05-16 NOTE — ED Provider Notes (Signed)
[facility] Provider Note   CSN: [id number] Arrival date & time: 05/16/19  1400     History   Chief Complaint Chief Complaint  Patient presents with  . Eye Pain    HPI Morgan Obrien is a 58 y.o. female with a past medical history of GERD who presents to ED with a chief complaint of left-sided eye irritation.  She was at a beauty salon getting Ilex extensions placed which she has done in the past without any issues.  States that the technician told her "something did not look right" and then patient began having foreign body sensation in her left eye.  She was able to flush out the eye but continues to have some irritation.  She does not wear contact lenses.  She has reading glasses which she left at home.  Denies any swelling, drainage, headache or other trauma.     HPI  Past Medical History:  Diagnosis Date  . GERD (gastroesophageal reflux disease)   . Medical history non-contributory     Patient Active Problem List   Diagnosis Date Noted  . HEADACHE 12/24/2009  . SKIN RASH 11/12/2009  . ELEVATED BLOOD PRESSURE WITHOUT DIAGNOSIS OF HYPERTENSION 08/15/2009  . VAGINITIS, CANDIDAL 06/20/2009  . URINALYSIS, ABNORMAL 06/20/2009  . OTHER UNSPECIFIED BACK DISORDER 05/10/2009  . 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 TOBACCO ABUSE 02/20/2009  . PHLEBITIS, LOWER EXTREMITY 11/13/2008  . MYALGIA 02/20/2008  . GERD 01/04/2007  . 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 SEBORRHEIC DERMATITIS 07/26/2002  . HERPETIC WHITLOW 02/15/2002  . SCHIZOAFFECTIVE DISORDER UNSPECIFIED 07/09/1998    Past Surgical History:  Procedure Laterality Date  . CERVICAL FUSION    . SUBLINGUAL SALIVARY CYST EXCISION Bilateral 05/31/2015   Procedure: REMOVAL OF BILATERAL PALATAL TORI, BUCCAL EXOSTOSES RIGHT AND LEFT MAXILLA, AND HYPERPLASTIC MAXILLARY OSSEOUS TUBEROSITIES and ORIF of palatal fracture;  Surgeon: Ocie DoyneScott Jensen, DDS;  Location: [facility] OR;  Service: Oral Surgery;   Laterality: Bilateral;     OB History   No obstetric history on file.      Home Medications    Prior to Admission medications   Medication Sig Start Date End Date Taking? Authorizing Provider  ibuprofen (ADVIL,MOTRIN) 200 MG tablet Take 200 mg by mouth every 6 (six) hours as needed for mild pain.    [provider]  Multiple Vitamin (MULTIVITAMIN WITH MINERALS) TABS tablet Take 1 tablet by mouth daily.    [provider]  oxyCODONE-acetaminophen (PERCOCET) 5-325 MG tablet Take 1-2 tablets by mouth every 4 (four) hours as needed for severe pain. 05/31/15   Ocie DoyneJensen, Scott, DDS  potassium chloride SA (K-DUR) 20 MEQ tablet Take 1 tablet (20 mEq total) by mouth 2 (two) times daily. 02/05/19   Renne CriglerGeiple, Joshua, PA-C  sulfacetamide (BLEPH-10) 10 % ophthalmic solution Place 1 drop into the left eye every 4 (four) hours. 05/16/19   Dietrich PatesKhatri, Rally Ouch, PA-C    Family History Family History  Problem Relation Age of Onset  . Breast cancer Neg Hx     Social History Social History   Tobacco Use  . Smoking status: Current Every Day Smoker    Packs/day: 0.50    Years: 25.00    Pack years: 12.50  Substance Use Topics  . Alcohol use: No  . Drug use: No     Allergies   Patient has no known allergies.   Review of Systems Review of Systems  Constitutional: Negative for chills and fever.  Eyes: Positive for pain and  redness. Negative for photophobia, discharge and visual disturbance.  Neurological: Negative for headaches.     Physical Exam Updated Vital Signs BP (!) 151/98 (BP Location: Right Arm)   Pulse 95   Temp 98.6 F (37 C) (Oral)   Resp 20   SpO2 97%   Physical Exam Vitals signs and nursing note reviewed.  Constitutional:      General: She is not in acute distress.    Appearance: She is well-developed. She is not diaphoretic.  HENT:     Head: Normocephalic and atraumatic.  Eyes:     General: Lids are everted, no foreign bodies appreciated. No scleral  icterus.    Conjunctiva/sclera: Conjunctivae normal.      Comments: Small corneal abrasion noted in the 4 o'clock position of the left eye. Left eye with injected conjunctiva, no eyelid swelling or erythema or tenderness to palpation.  Mild clear tearful drainage noted.  No foreign bodies noted.  No pain with EOMs.  No chemosis, proptosis, or consensual photophobia. Fluorescein stain with no foreign bodies, dendritic lesions, ulcerations, negative Sidel sign.  Neck:     Musculoskeletal: Normal range of motion.  Pulmonary:     Effort: Pulmonary effort is normal. No respiratory distress.  Skin:    Findings: No rash.  Neurological:     Mental Status: She is alert.      ED Treatments / Results  Labs (all labs ordered are listed, but only abnormal results are displayed) Labs Reviewed - No data to display  EKG None  Radiology No results found.  Procedures Procedures (including critical care time)  Medications Ordered in ED Medications  fluorescein ophthalmic strip 1 strip (1 strip Left Eye Given 05/16/19 1455)  tetracaine (PONTOCAINE) 0.5 % ophthalmic solution 2 drop (2 drops Left Eye Given 05/16/19 1454)     Initial Impression / Assessment and Plan / ED Course  I have reviewed the triage vital signs and the nursing notes.  Pertinent labs & imaging results that were available during my care of the patient were reviewed by me and considered in my medical decision making (see chart for details).        58 year old female presenting to the ED for left eye irritation.  She was having eyelash extensions placed to when her symptoms began.  Had foreign body sensation but is now having irritation.  Denies any vision changes.  Small corneal abrasion noted in the 4 o'clock position of the left eye.  Will place on sulfacetamide drops with ophthalmology follow-up.    Patient is hemodynamically stable, in NAD, and able to ambulate in the ED. Evaluation does not show pathology that would  require ongoing emergent intervention or inpatient treatment. I explained the diagnosis to the patient. Pain has been managed and has no complaints prior to discharge. Patient is comfortable with above plan and is stable for discharge at this time. All questions were answered prior to disposition. Strict return precautions for returning to the ED were discussed. Encouraged follow up with PCP.   An After Visit Summary was printed and given to the patient.   Portions of this note were generated with Scientist, clinical (histocompatibility and immunogenetics). Dictation errors may occur despite best attempts at proofreading.   Final Clinical Impressions(s) / ED Diagnoses   Final diagnoses:  Abrasion of left cornea, initial encounter    ED Discharge Orders         Ordered    sulfacetamide (BLEPH-10) 10 % ophthalmic solution  Every 4 hours  05/16/19 [place], [place], PA-C 05/16/19 1635    Virgel Manifold, MD 05/17/19 [phone]

## 2019-05-16 NOTE — ED Notes (Signed)
Patient verbalizes understanding of discharge instructions. Opportunity for questioning and answers were provided. Armband removed by staff, pt discharged from ED ambulatory.   

## 2019-05-16 NOTE — Discharge Instructions (Signed)
Return to the ED for worsening symptoms, additional trauma to the area, swelling around your eye, trouble moving your eye or increased pain.

## 2019-07-11 ENCOUNTER — Ambulatory Visit: Payer: Medicaid Other | Admitting: Physical Therapy

## 2019-08-03 ENCOUNTER — Ambulatory Visit: Payer: Medicaid Other | Attending: Physician Assistant | Admitting: Physical Therapy

## 2019-08-21 ENCOUNTER — Ambulatory Visit: Payer: Medicaid Other | Attending: Physician Assistant | Admitting: Physical Therapy

## 2019-09-18 ENCOUNTER — Ambulatory Visit: Payer: Medicaid Other | Attending: Physician Assistant | Admitting: Physical Therapy

## 2021-07-19 ENCOUNTER — Ambulatory Visit (HOSPITAL_COMMUNITY)
Admission: EM | Admit: 2021-07-19 | Discharge: 2021-07-19 | Disposition: A | Discharge status: Home / Self Care | Payer: Medicaid Other | Attending: Physician Assistant | Admitting: Physician Assistant

## 2021-07-19 ENCOUNTER — Other Ambulatory Visit: Payer: Self-pay

## 2021-07-19 ENCOUNTER — Encounter (HOSPITAL_COMMUNITY): Payer: Self-pay | Admitting: *Deleted

## 2021-07-19 DIAGNOSIS — M25561 Pain in right knee: Secondary | ICD-10-CM | POA: Diagnosis not present

## 2021-07-19 DIAGNOSIS — M542 Cervicalgia: Secondary | ICD-10-CM

## 2021-07-19 DIAGNOSIS — M79641 Pain in right hand: Secondary | ICD-10-CM | POA: Diagnosis not present

## 2021-07-19 DIAGNOSIS — M79642 Pain in left hand: Secondary | ICD-10-CM | POA: Diagnosis not present

## 2021-07-19 MED ORDER — CYCLOBENZAPRINE HCL 10 MG PO TABS: 10.0000 mg | ORAL_TABLET | Freq: Two times a day (BID) | ORAL | 0 refills | Status: DC | PRN

## 2021-07-19 MED ORDER — PREDNISONE 20 MG PO TABS: 40.0000 mg | ORAL_TABLET | Freq: Every day | ORAL | 0 refills | Status: AC
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
## 2021-07-19 NOTE — Discharge Instructions (Signed)
Please follow up if symptoms do not start to improve over the next 3-5 days.

## 2021-07-25 ENCOUNTER — Ambulatory Visit (INDEPENDENT_AMBULATORY_CARE_PROVIDER_SITE_OTHER): Payer: Medicaid Other

## 2021-07-25 ENCOUNTER — Other Ambulatory Visit: Payer: Self-pay

## 2021-07-25 ENCOUNTER — Ambulatory Visit (HOSPITAL_COMMUNITY)
Admission: EM | Admit: 2021-07-25 | Discharge: 2021-07-25 | Disposition: A | Discharge status: Home / Self Care | Payer: Medicaid Other | Attending: Physician Assistant | Admitting: Physician Assistant

## 2021-07-25 ENCOUNTER — Encounter (HOSPITAL_COMMUNITY): Payer: Self-pay

## 2021-07-25 DIAGNOSIS — M545 Low back pain, unspecified: Secondary | ICD-10-CM | POA: Diagnosis not present

## 2021-07-25 DIAGNOSIS — M79601 Pain in right arm: Secondary | ICD-10-CM

## 2021-07-25 DIAGNOSIS — M542 Cervicalgia: Secondary | ICD-10-CM

## 2021-07-25 MED ORDER — NAPROXEN 500 MG PO TABS: 500.0000 mg | ORAL_TABLET | Freq: Two times a day (BID) | ORAL | 0 refills | Status: AC

## 2021-07-25 MED ORDER — METHOCARBAMOL 500 MG PO TABS: 500.0000 mg | ORAL_TABLET | Freq: Three times a day (TID) | ORAL | 0 refills | Status: AC | PRN

## 2021-07-25 NOTE — ED Triage Notes (Signed)
Pt presents with c/o neck and back pain.   States she was given medicine for pain and states it has not helped.
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
## 2021-07-31 ENCOUNTER — Ambulatory Visit (HOSPITAL_COMMUNITY)
Admission: EM | Admit: 2021-07-31 | Discharge: 2021-07-31 | Disposition: A | Discharge status: Home / Self Care | Payer: Medicaid Other

## 2021-07-31 ENCOUNTER — Encounter (HOSPITAL_COMMUNITY): Payer: Self-pay

## 2021-07-31 ENCOUNTER — Other Ambulatory Visit: Payer: Self-pay

## 2021-07-31 DIAGNOSIS — T50901A Poisoning by unspecified drugs, medicaments and biological substances, accidental (unintentional), initial encounter: Secondary | ICD-10-CM

## 2021-07-31 NOTE — Discharge Instructions (Signed)
You are ok to take your regular medicines. Do not take any stomach acid medicines today such as prilosec, nexium, zantac, or pepcid.

## 2021-07-31 NOTE — ED Provider Notes (Signed)
[facility]    CSN: [id number] Arrival date & time: 07/31/21  1203      History   Chief Complaint No chief complaint on file.   HPI Morgan Obrien is a 61 y.o. female.  Patient accidentally took her daughter's pantoprazole today.  Mistook it for her own medication.  She is worried because she does not know what medication this is or what side effects it may cause.  She also does not know if she is allowed to take her own medication since she took the incorrect one. Has taken prilosec in the past, is not taking it or any other acid reducer med at this time.   HPI  Past Medical History:  Diagnosis Date   GERD (gastroesophageal reflux disease)    Medical history non-contributory     Patient Active Problem List   Diagnosis Date Noted   HEADACHE 12/24/2009   SKIN RASH 11/12/2009   ELEVATED BLOOD PRESSURE WITHOUT DIAGNOSIS OF HYPERTENSION 08/15/2009   VAGINITIS, CANDIDAL 06/20/2009   URINALYSIS, ABNORMAL 06/20/2009   OTHER UNSPECIFIED BACK DISORDER 05/10/2009   HSV 02/20/2009   TOBACCO ABUSE 02/20/2009   PHLEBITIS, LOWER EXTREMITY 11/13/2008   MYALGIA 02/20/2008   GERD 01/04/2007   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      Home Medications    Prior to Admission medications   Medication Sig Start Date End Date Taking? Authorizing Provider  ibuprofen (ADVIL,MOTRIN) 200 MG tablet Take 200 mg by mouth  every 6 (six) hours as needed for mild pain.    [provider]  methocarbamol (ROBAXIN) 500 MG tablet Take 1 tablet (500 mg total) by mouth every 8 (eight) hours as needed for muscle spasms. 07/25/21   Raspet, Noberto Retort, PA-C  Multiple Vitamin (MULTIVITAMIN WITH MINERALS) TABS tablet Take 1 tablet by mouth daily.    [provider]  naproxen (NAPROSYN) 500 MG tablet Take 1 tablet (500 mg total) by mouth 2 (two) times daily. 07/25/21   Raspet, Noberto Retort, PA-C  oxyCODONE-acetaminophen (PERCOCET) 5-325 MG tablet Take 1-2 tablets by mouth every 4 (four) hours as needed for severe pain. 05/31/15   Ocie Doyne, DMD  potassium chloride SA (K-DUR) 20 MEQ tablet Take 1 tablet (20 mEq total) by mouth 2 (two) times daily. 02/05/19   Renne Crigler, PA-C  sulfacetamide (BLEPH-10) 10 % ophthalmic solution Place 1 drop into the left eye every 4 (four) hours. 05/16/19   Dietrich Pates, PA-C    Family History Family History  Problem Relation Age of Onset   Breast cancer Neg Hx     Social History Social History   Tobacco Use   Smoking status: Every Day    Packs/day: 0.50    Years: 25.00    Pack years: 12.50    Types: Cigarettes  Substance Use Topics   Alcohol use: No   Drug use: No     Allergies   Patient has no known allergies.   Review of Systems  Review of Systems   Physical Exam Triage Vital Signs ED Triage Vitals  Enc Vitals Group     BP 07/31/21 1310 122/83     Pulse Rate 07/31/21 1310 88     Resp 07/31/21 1310 20     Temp --      Temp src --      SpO2 07/31/21 1310 100 %     Weight --      Height --      Head Circumference --      Peak Flow --      Pain Score 07/31/21 1309 0     Pain Loc --      Pain Edu? --      Excl. in [place]? --    No data found.  Updated Vital Signs BP 122/83 (BP Location: Right Arm)    Pulse 88    Resp 20    SpO2 100%   Visual Acuity Right Eye Distance:   Left Eye Distance:   Bilateral Distance:    Right Eye Near:   Left Eye Near:     Bilateral Near:     Physical Exam Constitutional:      General: She is not in acute distress.    Appearance: Normal appearance. She is not ill-appearing.  Pulmonary:     Effort: Pulmonary effort is normal.  Neurological:     Mental Status: She is alert.     Gait: Gait normal.     UC Treatments / Results  Labs (all labs ordered are listed, but only abnormal results are displayed) Labs Reviewed - No data to display  EKG   Radiology No results found.  Procedures Procedures (including critical care time)  Medications Ordered in UC Medications - No data to display  Initial Impression / Assessment and Plan / UC Course  I have reviewed the triage vital signs and the nursing notes.  Pertinent labs & imaging results that were available during my care of the patient were reviewed by me and considered in my medical decision making (see chart for details).    Reassured pt she is ok and should not have side effects after taking wrong med pantoprazole.  Gave okay to take her own medications Robaxin and naproxen.    Final Clinical Impressions(s) / UC Diagnoses   Final diagnoses:  Accidental medication error, initial encounter     Discharge Instructions      You are ok to take your regular medicines. Do not take any stomach acid medicines today such as prilosec, nexium, zantac, or pepcid.    ED Prescriptions   None    PDMP not reviewed this encounter.   Cathlyn Parsons, NP 07/31/21 1352

## 2021-07-31 NOTE — ED Triage Notes (Signed)
Pt reports taking Pantoprazole with out being prescribed to her.   States the medications are her daughters.   States she felt some tingling in her hands.

## 2021-08-05 ENCOUNTER — Encounter: Payer: Self-pay | Admitting: Orthopaedic Surgery

## 2021-08-05 ENCOUNTER — Other Ambulatory Visit: Payer: Self-pay

## 2021-08-05 ENCOUNTER — Ambulatory Visit (INDEPENDENT_AMBULATORY_CARE_PROVIDER_SITE_OTHER): Payer: Self-pay | Admitting: Orthopaedic Surgery

## 2021-08-05 DIAGNOSIS — M542 Cervicalgia: Secondary | ICD-10-CM

## 2021-08-05 MED ORDER — TRAMADOL HCL 50 MG PO TABS: 50.0000 mg | ORAL_TABLET | Freq: Three times a day (TID) | ORAL | 0 refills | Status: DC | PRN
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
## 2021-08-06 ENCOUNTER — Encounter (HOSPITAL_COMMUNITY): Payer: Self-pay | Admitting: Emergency Medicine

## 2021-08-06 ENCOUNTER — Ambulatory Visit (HOSPITAL_COMMUNITY)
Admission: EM | Admit: 2021-08-06 | Discharge: 2021-08-06 | Disposition: A | Discharge status: Home / Self Care | Payer: Medicaid Other

## 2021-08-06 ENCOUNTER — Other Ambulatory Visit: Payer: Self-pay

## 2021-08-06 DIAGNOSIS — M65322 Trigger finger, left index finger: Secondary | ICD-10-CM | POA: Diagnosis not present

## 2021-08-06 NOTE — Discharge Instructions (Addendum)
As we discussed, it is best to discuss possible treatments for your trigger finger with your hand surgeon.  Please make an appointment to follow-up with him if you have any questions or concerns.

## 2021-08-06 NOTE — ED Provider Notes (Signed)
[facility]    CSN: [id number] Arrival date & time: 08/06/21  1038      History   Chief Complaint Chief Complaint  Patient presents with   Hand Pain    HPI Morgan Obrien is a 61 y.o. female.   Right index finger trigger finger Patient has a history of trigger finger and follows with hand surgeon Dr. Kelli Hope Patient reports that she has received a trigger finger injection previously She states that she has not had any change in her pain and this pain has been occurring for over a year She presents today because she wants to discuss what treatment options are available for trigger fingers and what she can do at work   Past Medical History:  Diagnosis Date   GERD (gastroesophageal reflux disease)    Medical history non-contributory     Patient Active Problem List   Diagnosis Date Noted   HEADACHE 12/24/2009   SKIN RASH 11/12/2009   ELEVATED BLOOD PRESSURE WITHOUT DIAGNOSIS OF HYPERTENSION 08/15/2009   VAGINITIS, CANDIDAL 06/20/2009   URINALYSIS, ABNORMAL 06/20/2009   OTHER UNSPECIFIED BACK DISORDER 05/10/2009   HSV 02/20/2009   TOBACCO ABUSE 02/20/2009   PHLEBITIS, LOWER EXTREMITY 11/13/2008   MYALGIA 02/20/2008   GERD 01/04/2007   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      Home Medications    Prior to Admission medications   Medication Sig Start Date End Date Taking? Authorizing  Provider  ibuprofen (ADVIL,MOTRIN) 200 MG tablet Take 200 mg by mouth every 6 (six) hours as needed for mild pain.    [provider]  methocarbamol (ROBAXIN) 500 MG tablet Take 1 tablet (500 mg total) by mouth every 8 (eight) hours as needed for muscle spasms. 07/25/21   Raspet, Noberto Retort, PA-C  Multiple Vitamin (MULTIVITAMIN WITH MINERALS) TABS tablet Take 1 tablet by mouth daily.    [provider]  naproxen (NAPROSYN) 500 MG tablet Take 1 tablet (500 mg total) by mouth 2 (two) times daily. 07/25/21   Raspet, Noberto Retort, PA-C  oxyCODONE-acetaminophen (PERCOCET) 5-325 MG tablet Take 1-2 tablets by mouth every 4 (four) hours as needed for severe pain. 05/31/15   Ocie Doyne, DMD  potassium chloride SA (K-DUR) 20 MEQ tablet Take 1 tablet (20 mEq total) by mouth 2 (two) times daily. 02/05/19   Renne Crigler, PA-C  sulfacetamide (BLEPH-10) 10 % ophthalmic solution Place 1 drop into the left eye every 4 (four) hours. 05/16/19   Khatri, Hina, PA-C  traMADol (ULTRAM) 50 MG tablet Take 1 tablet (50 mg total) by mouth 3 (three) times daily as needed. 08/05/21   Cristie Hem, PA-C    Family History Family History  Problem Relation Age of Onset   Breast cancer Neg Hx     Social History Social History   Tobacco Use   Smoking status: Every Day    Packs/day: 0.50    Years: 25.00    Pack years: 12.50  Types: Cigarettes  Substance Use Topics   Alcohol use: No   Drug use: No     Allergies   Patient has no known allergies.   Review of Systems Review of Systems  All other systems reviewed and are negative. Per HPI  Physical Exam Triage Vital Signs ED Triage Vitals [08/06/21 1233]  Enc Vitals Group     BP 120/77     Pulse Rate 84     Resp 18     Temp 98.4 F (36.9 C)     Temp Source Oral     SpO2 100 %     Weight      Height      Head Circumference      Peak Flow      Pain Score 10     Pain Loc      Pain Edu?      Excl. in GC?    No data found.  Updated  Vital Signs BP 120/77 (BP Location: Right Arm)    Pulse 84    Temp 98.4 F (36.9 C) (Oral)    Resp 18    SpO2 100%   Visual Acuity Right Eye Distance:   Left Eye Distance:   Bilateral Distance:    Right Eye Near:   Left Eye Near:    Bilateral Near:     Physical Exam Constitutional:      Appearance: Normal appearance.  HENT:     Head: Normocephalic and atraumatic.  Eyes:     Conjunctiva/sclera: Conjunctivae normal.  Cardiovascular:     Rate and Rhythm: Normal rate.  Pulmonary:     Effort: Pulmonary effort is normal.  Musculoskeletal:     Cervical back: Normal range of motion.     Comments: Left Hand: Inspection: There is some atrophy of the interosseous muscles of the first digit on the left hand and a hypopigmented macule on the palmar aspect of her second MCP.  No swelling, erythema or bruising b/l Palpation: no TTP b/l ROM: Full ROM of the digits and wrist b/l. Fully able to extend left second finger, however flexion is slightly limited.  Strength: 5/5 strength in the forearm, wrist and interosseus muscles b/l Neurovascular: NV intact b/l   Neurological:     Mental Status: She is alert.     UC Treatments / Results  Labs (all labs ordered are listed, but only abnormal results are displayed) Labs Reviewed - No data to display  EKG   Radiology No results found.  Procedures Procedures (including critical care time)  Medications Ordered in UC Medications - No data to display  Initial Impression / Assessment and Plan / UC Course  I have reviewed the triage vital signs and the nursing notes.  Pertinent labs & imaging results that were available during my care of the patient were reviewed by me and considered in my medical decision making (see chart for details).     Discussed with patient that given she is under the treatment of a hand specialist that they would be the best person to discuss treatment options for her trigger finger.  She did specifically ask  if surgery is an option and advised that this is a common treatment for trigger finger, however would defer to her particular surgeon to discuss her particular situation with her.  Would not provide her any limitations with work, if there are any specifically that she would request, recommend follow-up with her hand surgeon for this.  Did discuss with her  that the hypopigmentation on her palm may be related to the reported trigger finger injection that she has previously had.   Final Clinical Impressions(s) / UC Diagnoses   Final diagnoses:  Trigger index finger of left hand     Discharge Instructions      As we discussed, it is best to discuss possible treatments for your trigger finger with your hand surgeon.  Please make an appointment to follow-up with him if you have any questions or concerns.     ED Prescriptions   None    PDMP not reviewed this encounter.   MeccarielloSolmon Ice, Adalyne Lovick J, DO 08/06/21 1304

## 2021-08-06 NOTE — ED Triage Notes (Signed)
Patient reports left index finger pain that has been bothering her since Feb 2022. Pt thinks it is trigger finger and would like it looked at so she can be told about her work limitations.

## 2021-08-19 ENCOUNTER — Telehealth: Payer: Self-pay

## 2021-08-19 ENCOUNTER — Other Ambulatory Visit: Payer: Self-pay | Admitting: Physician Assistant

## 2021-08-19 MED ORDER — TRAMADOL HCL 50 MG PO TABS: 50.0000 mg | ORAL_TABLET | Freq: Three times a day (TID) | ORAL | 0 refills | Status: DC | PRN

## 2021-08-19 NOTE — Telephone Encounter (Signed)
Patient states pharmacy does not have Rx that was sent on 08/05/2021.  Can you please resend.

## 2021-08-19 NOTE — Telephone Encounter (Signed)
Tried calling pt to inform. No answer and voice mail was full

## 2021-08-19 NOTE — Telephone Encounter (Signed)
Not sure what happened as it looks like I sent to [facility].  I will resend

## 2021-08-20 ENCOUNTER — Other Ambulatory Visit: Payer: Self-pay | Admitting: Neurosurgery

## 2021-08-20 DIAGNOSIS — M542 Cervicalgia: Secondary | ICD-10-CM

## 2021-08-24 ENCOUNTER — Other Ambulatory Visit: Payer: Self-pay

## 2021-08-24 ENCOUNTER — Ambulatory Visit
Admission: RE | Admit: 2021-08-24 | Discharge: 2021-08-24 | Disposition: A | Discharge status: Home / Self Care | Payer: Medicaid Other | Source: Ambulatory Visit | Attending: Neurosurgery | Admitting: Neurosurgery

## 2021-08-24 DIAGNOSIS — M542 Cervicalgia: Secondary | ICD-10-CM

## 2021-10-20 NOTE — Telephone Encounter (Signed)
Error

## 2021-12-25 ENCOUNTER — Telehealth: Payer: Self-pay | Admitting: Orthopaedic Surgery

## 2021-12-25 ENCOUNTER — Other Ambulatory Visit: Payer: Self-pay | Admitting: Physician Assistant

## 2021-12-25 MED ORDER — TRAMADOL HCL 50 MG PO TABS: 50.0000 mg | ORAL_TABLET | Freq: Three times a day (TID) | ORAL | 2 refills | Status: AC | PRN

## 2021-12-25 NOTE — Telephone Encounter (Signed)
Sent in

## 2021-12-25 NOTE — Telephone Encounter (Signed)
Pt called requesting a refill of tramadol. Please send to the pharmacy on file. Pt phone number is [phone].

## 2022-09-29 ENCOUNTER — Other Ambulatory Visit: Payer: Self-pay | Admitting: Physician Assistant

## 2022-10-20 ENCOUNTER — Other Ambulatory Visit: Payer: Self-pay | Admitting: Physician Assistant

## 2023-05-05 DIAGNOSIS — H04123 Dry eye syndrome of bilateral lacrimal glands: Secondary | ICD-10-CM | POA: Diagnosis not present

## 2023-12-22 ENCOUNTER — Other Ambulatory Visit: Payer: Self-pay | Admitting: Physician Assistant

## 2023-12-22 DIAGNOSIS — Z1231 Encounter for screening mammogram for malignant neoplasm of breast: Secondary | ICD-10-CM

## 2023-12-23 ENCOUNTER — Ambulatory Visit
Admission: RE | Admit: 2023-12-23 | Discharge: 2023-12-23 | Disposition: A | Discharge status: Home / Self Care | Source: Ambulatory Visit | Attending: Physician Assistant | Admitting: Physician Assistant

## 2023-12-23 DIAGNOSIS — Z1231 Encounter for screening mammogram for malignant neoplasm of breast: Secondary | ICD-10-CM

## 2023-12-28 ENCOUNTER — Other Ambulatory Visit: Payer: Self-pay | Admitting: Physician Assistant

## 2023-12-28 DIAGNOSIS — R928 Other abnormal and inconclusive findings on diagnostic imaging of breast: Secondary | ICD-10-CM

## 2024-07-25 ENCOUNTER — Ambulatory Visit (HOSPITAL_COMMUNITY)
Admission: EM | Admit: 2024-07-25 | Discharge: 2024-07-25 | Disposition: A | Discharge status: Home / Self Care | Payer: MEDICAID | Attending: Emergency Medicine | Admitting: Emergency Medicine

## 2024-07-25 ENCOUNTER — Encounter (HOSPITAL_COMMUNITY): Payer: Self-pay | Admitting: Emergency Medicine

## 2024-07-25 DIAGNOSIS — R6889 Other general symptoms and signs: Secondary | ICD-10-CM | POA: Diagnosis not present

## 2024-07-25 DIAGNOSIS — R5383 Other fatigue: Secondary | ICD-10-CM | POA: Diagnosis not present

## 2024-07-25 LAB — POCT INFLUENZA A/B
Influenza A, POC: NEGATIVE
Influenza B, POC: NEGATIVE

## 2024-07-25 LAB — GLUCOSE, POCT (MANUAL RESULT ENTRY): POCT Glucose (KUC): 116 mg/dL — AB (ref 70–99)

## 2024-07-25 MED ORDER — BENZONATATE 100 MG PO CAPS: 100.0000 mg | ORAL_CAPSULE | Freq: Three times a day (TID) | ORAL | 0 refills | Status: AC

## 2024-07-25 NOTE — Discharge Instructions (Addendum)
 Flu testing was negative, you most likely have a different viral illness.  Alternate between 600 mg of ibuprofen and 500 mg of Tylenol  every 4-6 hours to help with any fever, body aches or chills. Take the tessalon  every 8 hours for cough suppression.   Ensure you are getting plenty of rest and drinking at least 64 ounces of water daily.  Viral illnesses typically last around a week or so, if your symptoms last longer than a week or if you develop any new concerning symptoms seek follow-up care.

## 2024-07-25 NOTE — ED Triage Notes (Signed)
 Patient is fatigue, chills and body aches yesterday however today it feels much worst.  Patient had pneumonia.  Patient took mucinex cough and tylenol 

## 2024-07-25 NOTE — ED Provider Notes (Signed)
 " [facility]    CSN: [id number] Arrival date & time: 07/25/24  1916      History   Chief Complaint Chief Complaint  Patient presents with   Fatigue   Chills   Generalized Body Aches    HPI Morgan Obrien is a 64 y.o. female.   Patient presents to clinic over concern of fatigue, chills, body aches and not feeling well that started yesterday.  She has not had nasal congestion or sore throat.  Has not measured temperature at home.  Does note that the chills have been ongoing since she was diagnosed with pneumonia in October 2025. She does note that when she puts her mask when she has trouble breathing.  Has tried Mucinex and Tylenol  without much improvement.  Was seen on 1/5 for cough.  Prescribed prednisone .  She had negative COVID and flu testing at this visit.  Reports she has since improved from that visit and gotten sick again.  The history is provided by the patient and medical records.    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      Home Medications    Prior to Admission medications  Medication Sig Start Date End Date Taking? Authorizing Provider  benzonatate  (TESSALON ) 100 MG capsule Take 1 capsule (100 mg total) by mouth every 8 (eight) hours. 07/25/24  Yes Ball, Jen Eppinger  G, FNP  ibuprofen (ADVIL,MOTRIN) 200 MG tablet Take 200 mg by mouth every 6 (six) hours as needed for mild pain.    [provider]  methocarbamol  (ROBAXIN ) 500 MG tablet Take 1 tablet (500 mg total) by mouth every 8 (eight) hours as needed for muscle spasms. 07/25/21   Raspet, Erin K, PA-C  Multiple Vitamin (MULTIVITAMIN WITH MINERALS) TABS tablet Take 1 tablet by mouth daily.    [provider]  naproxen  (NAPROSYN ) 500 MG tablet Take 1 tablet (500 mg total) by mouth 2 (two) times daily. 07/25/21   Raspet, Erin K, PA-C  oxyCODONE -acetaminophen  (PERCOCET) 5-325 MG tablet Take 1-2 tablets by mouth every 4 (four) hours as needed for severe pain. 05/31/15   Primrose Glendia, DMD  potassium chloride  SA (K-DUR) 20 MEQ tablet Take 1 tablet (20 mEq total) by mouth 2 (two) times daily. 02/05/19   Desiderio Chew, PA-C  sulfacetamide  (BLEPH -10) 10 % ophthalmic solution Place 1 drop into the left eye every 4 (four) hours. 05/16/19   Khatri, Hina, PA-C  traMADol  (ULTRAM ) 50  MG tablet Take 1 tablet (50 mg total) by mouth 3 (three) times daily as needed. 12/25/21   Stanbery, Mary L, PA-C    Family History Family History  Problem Relation Age of Onset   Breast cancer Neg Hx     Social History Social History[1]   Allergies   Patient has no known allergies.   Review of Systems Review of Systems  Per HPI  Physical Exam Triage Vital Signs ED Triage Vitals  Encounter Vitals Group     BP 07/25/24 2003 110/83     Girls Systolic BP Percentile --      Girls Diastolic BP Percentile --       Boys Systolic BP Percentile --      Boys Diastolic BP Percentile --      Pulse Rate 07/25/24 2003 (!) 107     Resp 07/25/24 2003 16     Temp 07/25/24 2003 99.6 F (37.6 C)     Temp Source 07/25/24 2003 Oral     SpO2 07/25/24 2003 96 %     Weight --      Height --      Head Circumference --      Peak Flow --      Pain Score 07/25/24 2002 0     Pain Loc --      Pain Education --      Exclude from Growth Chart --    No data found.  Updated Vital Signs BP 110/83 (BP Location: Left Arm)   Pulse (!) 107   Temp 99.6 F (37.6 C) (Oral)   Resp 16   SpO2 96%   Visual Acuity Right Eye Distance:   Left Eye Distance:   Bilateral Distance:    Right Eye Near:   Left Eye Near:    Bilateral Near:     Physical Exam Vitals and nursing note reviewed.  Constitutional:      Appearance: Normal appearance.  HENT:     Head: Normocephalic and atraumatic.     Right Ear: External ear normal.     Left Ear: External ear normal.     Nose: Congestion and rhinorrhea present.     Mouth/Throat:     Mouth: Mucous membranes are moist.  Eyes:     Conjunctiva/sclera: Conjunctivae normal.  Cardiovascular:     Rate and Rhythm: Normal rate and regular rhythm.     Heart sounds: Normal heart sounds.  Pulmonary:     Effort: Pulmonary effort is normal. No respiratory distress.     Breath sounds: Normal breath sounds. No wheezing.  Skin:    General: Skin is warm and dry.  Neurological:     General: No focal deficit present.     Mental Status: She is alert.  Psychiatric:        Mood and Affect: Mood normal.      UC Treatments / Results  Labs (all labs ordered are listed, but only abnormal results are displayed) Labs Reviewed  GLUCOSE, POCT (MANUAL RESULT ENTRY) - Abnormal; Notable for the following components:      Result Value   POCT Glucose ([facility]) 116 (*)    All other components within normal limits  POCT INFLUENZA A/B    EKG   Radiology No results found.  Procedures Procedures  (including critical care time)  Medications Ordered in UC Medications - No data to display  Initial Impression / Assessment and Plan / UC Course  I have reviewed the triage vital signs and the  nursing notes.  Pertinent labs & imaging results that were available during my care of the patient were reviewed by me and considered in my medical decision making (see chart for details).  Vitals and triage reviewed, patient is hemodynamically stable.  Lungs vesicular, heart with regular rate and rhythm.  Congestion present.  Patient with fatigue and not feeling well.  CBG unremarkable.  Influenza testing negative.  Suspect viral URI, symptomatic management discussed.  Cough management reviewed.  Patient inquired about vitamin D and iron levels, encourage PCP follow-up if symptoms do not improve for further evaluation. Is established w/ PCP.   Plan of care, follow-up care and return precautions given, no questions at this time.     Final Clinical Impressions(s) / UC Diagnoses   Final diagnoses:  Other fatigue  Flu-like symptoms     Discharge Instructions      Flu testing was negative, you most likely have a different viral illness.  Alternate between 600 mg of ibuprofen and 500 mg of Tylenol  every 4-6 hours to help with any fever, body aches or chills. Take the tessalon  every 8 hours for cough suppression.   Ensure you are getting plenty of rest and drinking at least 64 ounces of water daily.  Viral illnesses typically last around a week or so, if your symptoms last longer than a week or if you develop any new concerning symptoms seek follow-up care.     ED Prescriptions     Medication Sig Dispense Auth. Provider   benzonatate  (TESSALON ) 100 MG capsule Take 1 capsule (100 mg total) by mouth every 8 (eight) hours. 21 capsule Ball, Deashia Soule  G, FNP      PDMP not reviewed this encounter.     [1]  Social History Tobacco Use   Smoking status: Every Day    Current packs/day: 0.50     Average packs/day: 0.5 packs/day for 25.0 years (12.5 ttl pk-yrs)    Types: Cigarettes  Substance Use Topics   Alcohol use: No   Drug use: No     Mercer, Teniyah Seivert  G, FNP 07/25/24 2039  "

## 2024-07-27 ENCOUNTER — Telehealth (HOSPITAL_COMMUNITY): Payer: Self-pay

## 2024-07-27 NOTE — Telephone Encounter (Signed)
 Informed by front desk staff:  Morgan Obrien, dob 09/30/1960, [phone], here Monday or Tuesday, questions about RX, please call.  Patient called back and states she had questions regarding alternating Tylenol  and ibuprofen. Wanting to know what recommended doses and types.    Went over the recommendation for standard tylenol  500 mg and Ibuprofen 600 mg alternating every 4-6 hours as needed. Informed the patient that she can take dose of Tylenol , wait the 4-6 hours, and then take the recommended dose of Ibuprofen going back and forth between the two medications as needed.   Patient verbalized understanding
